# Patient Record
Sex: Female | Born: 1985
Health system: Southern US, Community
[De-identification: ages and names within clinical notes are randomized; demographics above are authoritative.]

## PROBLEM LIST (undated history)

## (undated) ENCOUNTER — Inpatient Hospital Stay (HOSPITAL_COMMUNITY): Payer: Self-pay

## (undated) DIAGNOSIS — Z8632 Personal history of gestational diabetes: Secondary | ICD-10-CM

## (undated) HISTORY — PX: OTHER SURGICAL HISTORY: SHX169

## (undated) HISTORY — DX: Personal history of gestational diabetes: Z86.32

---

## 2011-11-11 DIAGNOSIS — O1490 Unspecified pre-eclampsia, unspecified trimester: Secondary | ICD-10-CM

## 2017-04-05 ENCOUNTER — Encounter (HOSPITAL_COMMUNITY): Payer: Self-pay | Admitting: *Deleted

## 2017-04-05 ENCOUNTER — Inpatient Hospital Stay (HOSPITAL_COMMUNITY)
Admission: AD | Admit: 2017-04-05 | Discharge: 2017-04-05 | Disposition: A | Discharge status: Home / Self Care | Payer: Medicaid Other | Source: Ambulatory Visit | Attending: Family Medicine | Admitting: Family Medicine

## 2017-04-05 ENCOUNTER — Inpatient Hospital Stay (HOSPITAL_COMMUNITY): Payer: Medicaid Other

## 2017-04-05 ENCOUNTER — Other Ambulatory Visit: Payer: Self-pay

## 2017-04-05 DIAGNOSIS — O26892 Other specified pregnancy related conditions, second trimester: Secondary | ICD-10-CM | POA: Insufficient documentation

## 2017-04-05 DIAGNOSIS — Z3A15 15 weeks gestation of pregnancy: Secondary | ICD-10-CM | POA: Diagnosis not present

## 2017-04-05 DIAGNOSIS — O99891 Other specified diseases and conditions complicating pregnancy: Secondary | ICD-10-CM

## 2017-04-05 DIAGNOSIS — M549 Dorsalgia, unspecified: Secondary | ICD-10-CM | POA: Insufficient documentation

## 2017-04-05 DIAGNOSIS — O9989 Other specified diseases and conditions complicating pregnancy, childbirth and the puerperium: Secondary | ICD-10-CM | POA: Diagnosis not present

## 2017-04-05 DIAGNOSIS — R42 Dizziness and giddiness: Secondary | ICD-10-CM | POA: Diagnosis present

## 2017-04-05 LAB — COMPREHENSIVE METABOLIC PANEL
ALBUMIN: 3.6 g/dL (ref 3.5–5.0)
ALT: 7 U/L — ABNORMAL LOW (ref 14–54)
ANION GAP: 11 (ref 5–15)
AST: 16 U/L (ref 15–41)
Alkaline Phosphatase: 52 U/L (ref 38–126)
BUN: 10 mg/dL (ref 6–20)
CHLORIDE: 104 mmol/L (ref 101–111)
CO2: 21 mmol/L — AB (ref 22–32)
Calcium: 9.9 mg/dL (ref 8.9–10.3)
Creatinine, Ser: 0.61 mg/dL (ref 0.44–1.00)
GFR calc Af Amer: >60 mL/min (ref >60)
GFR calc non Af Amer: >60 mL/min (ref >60)
GLUCOSE: 101 mg/dL — AB (ref 65–99)
POTASSIUM: 3.5 mmol/L (ref 3.5–5.1)
SODIUM: 136 mmol/L (ref 135–145)
Total Bilirubin: 0.3 mg/dL (ref 0.3–1.2)
Total Protein: 8.2 g/dL — ABNORMAL HIGH (ref 6.5–8.1)

## 2017-04-05 LAB — HCG, QUANTITATIVE, PREGNANCY: hCG, Beta Chain, Quant, S: 93531 m[IU]/mL — ABNORMAL HIGH (ref <5)

## 2017-04-05 LAB — CBC
HCT: 38.2 % (ref 36.0–46.0)
Hemoglobin: 12.8 g/dL (ref 12.0–15.0)
MCH: 27.8 pg (ref 26.0–34.0)
MCHC: 33.5 g/dL (ref 30.0–36.0)
MCV: 83 fL (ref 78.0–100.0)
Platelets: 323 10*3/uL (ref 150–400)
RBC: 4.6 MIL/uL (ref 3.87–5.11)
RDW: 12.8 % (ref 11.5–15.5)
WBC: 16.4 10*3/uL — AB (ref 4.0–10.5)

## 2017-04-05 LAB — URINALYSIS, ROUTINE W REFLEX MICROSCOPIC
Bilirubin Urine: NEGATIVE
GLUCOSE, UA: NEGATIVE mg/dL
HGB URINE DIPSTICK: NEGATIVE
KETONES UR: NEGATIVE mg/dL
LEUKOCYTES UA: NEGATIVE
Nitrite: NEGATIVE
PROTEIN: NEGATIVE mg/dL
Specific Gravity, Urine: 1.017 (ref 1.005–1.030)
pH: 7 (ref 5.0–8.0)

## 2017-04-05 LAB — ABO/RH: ABO/RH(D): O POS

## 2017-04-05 NOTE — MAU Note (Addendum)
Today started feeling dizzy, at first it was when she stands, now it is even when she just sits up.  Low back has been hurting for a while, trying to wait for her first appt.  Got kind of scared as dizzy as she is feeling, thought she needed to come in.  preg confirmed at health dept

## 2017-04-05 NOTE — MAU Provider Note (Signed)
Chief Complaint: Back Pain and Dizziness   None     SUBJECTIVE HPI: Gina Garza is a 31 y.o. G2P1001 at 7939w1d by LMP who presents to maternity admissions reporting back pain x 3-4 days, worse when standing. The pain is constant sharp pain that does radiate around to her abdomen occasionally. She has no hx of back pain prior to the pregnancy.  She also reports dizziness today, enough that she did not go to work. She feels lightheaded when standing and has to sit down. Sitting/lying down makes the dizziness better. She is eating and drinking enough but reports her appetite is low. There are no other associated symptoms. She has a sure LMP but has irregular periods. She has not yet started prenatal care. She denies vaginal bleeding, vaginal itching/burning, urinary symptoms, h/a, dizziness, n/v, or fever/chills.     HPI  Past Medical History:  Diagnosis Date  . Hypertension    Pre-eclampsia   Past Surgical History:  Procedure Laterality Date  . CESAREAN SECTION     Social History   Socioeconomic History  . Marital status: Single    Spouse name: Not on file  . Number of children: Not on file  . Years of education: Not on file  . Highest education level: Not on file  Social Needs  . Financial resource strain: Not on file  . Food insecurity - worry: Not on file  . Food insecurity - inability: Not on file  . Transportation needs - medical: Not on file  . Transportation needs - non-medical: Not on file  Occupational History  . Not on file  Tobacco Use  . Smoking status: Never Smoker  . Smokeless tobacco: Never Used  Substance and Sexual Activity  . Alcohol use: No    Frequency: Never  . Drug use: No  . Sexual activity: Yes  Other Topics Concern  . Not on file  Social History Narrative  . Not on file   No current facility-administered medications on file prior to encounter.    Current Outpatient Medications on File Prior to Encounter  Medication Sig Dispense Refill  .  Prenatal Vit-Fe Fumarate-FA (MULTIVITAMIN-PRENATAL) 27-0.8 MG TABS tablet Take 1 tablet by mouth daily at 12 noon.     Not on File  ROS:  Review of Systems  Constitutional: Negative for chills, fatigue and fever.  Respiratory: Negative for shortness of breath.   Cardiovascular: Negative for chest pain.  Gastrointestinal: Positive for abdominal pain. Negative for nausea and vomiting.  Genitourinary: Negative for difficulty urinating, dysuria, flank pain, pelvic pain, vaginal bleeding, vaginal discharge and vaginal pain.  Musculoskeletal: Positive for back pain.  Neurological: Positive for dizziness and light-headedness. Negative for headaches.  Psychiatric/Behavioral: Negative.      I have reviewed patient's Past Medical Hx, Surgical Hx, Family Hx, Social Hx, medications and allergies.   Physical Exam   Patient Vitals for the past 24 hrs:  BP Temp Temp src Pulse Resp SpO2 Weight  04/05/17 2016 126/85 - - 86 - - -  04/05/17 2004 133/89 - - 81 - - -  04/05/17 1858 132/76 - - 73 - - -  04/05/17 1853 (!) 141/95 98.2 F (36.8 C) Oral 85 18 100 % 278 lb (126.1 kg)   Constitutional: Well-developed, well-nourished female in no acute distress.  Cardiovascular: normal rate Respiratory: normal effort GI: Abd soft, non-tender. No rebound tenderness or guarding. Pos BS x 4 MS: Extremities nontender, no edema, normal ROM Neurologic: Alert and oriented x 4.  GU:  Neg CVAT.  PELVIC EXAM: Deferred  Unable to obtain FHT by doppler   LAB RESULTS Results for orders placed or performed during the hospital encounter of 04/05/17 (from the past 24 hour(s))  Urinalysis, Routine w reflex microscopic     Status: Abnormal   Collection Time: 04/05/17  7:00 PM  Result Value Ref Range   Color, Urine YELLOW YELLOW   APPearance CLOUDY (A) CLEAR   Specific Gravity, Urine 1.017 1.005 - 1.030   pH 7.0 5.0 - 8.0   Glucose, UA NEGATIVE NEGATIVE mg/dL   Hgb urine dipstick NEGATIVE NEGATIVE   Bilirubin  Urine NEGATIVE NEGATIVE   Ketones, ur NEGATIVE NEGATIVE mg/dL   Protein, ur NEGATIVE NEGATIVE mg/dL   Nitrite NEGATIVE NEGATIVE   Leukocytes, UA NEGATIVE NEGATIVE  CBC     Status: Abnormal   Collection Time: 04/05/17  8:38 PM  Result Value Ref Range   WBC 16.4 (H) 4.0 - 10.5 K/uL   RBC 4.60 3.87 - 5.11 MIL/uL   Hemoglobin 12.8 12.0 - 15.0 g/dL   HCT 40.938.2 81.136.0 - 91.446.0 %   MCV 83.0 78.0 - 100.0 fL   MCH 27.8 26.0 - 34.0 pg   MCHC 33.5 30.0 - 36.0 g/dL   RDW 78.212.8 95.611.5 - 21.315.5 %   Platelets 323 150 - 400 K/uL  Comprehensive metabolic panel     Status: Abnormal   Collection Time: 04/05/17  8:38 PM  Result Value Ref Range   Sodium 136 135 - 145 mmol/L   Potassium 3.5 3.5 - 5.1 mmol/L   Chloride 104 101 - 111 mmol/L   CO2 21 (L) 22 - 32 mmol/L   Glucose, Bld 101 (H) 65 - 99 mg/dL   BUN 10 6 - 20 mg/dL   Creatinine, Ser 0.860.61 0.44 - 1.00 mg/dL   Calcium 9.9 8.9 - 57.810.3 mg/dL   Total Protein 8.2 (H) 6.5 - 8.1 g/dL   Albumin 3.6 3.5 - 5.0 g/dL   AST 16 15 - 41 U/L   ALT 7 (L) 14 - 54 U/L   Alkaline Phosphatase 52 38 - 126 U/L   Total Bilirubin 0.3 0.3 - 1.2 mg/dL   GFR calc non Af Amer >60 >60 mL/min   GFR calc Af Amer >60 >60 mL/min   Anion gap 11 5 - 15  ABO/Rh     Status: None (Preliminary result)   Collection Time: 04/05/17  8:38 PM  Result Value Ref Range   ABO/RH(D) O POS     --/--/O POS (11/27 2038)  IMAGING No results found. [place] Ob Comp Less 14 Wks  Result Date: 04/05/2017 CLINICAL DATA:  Low back pain EXAM: OBSTETRIC <14 WK ULTRASOUND TECHNIQUE: Transabdominal ultrasound was performed for evaluation of the gestation as well as the maternal uterus and adnexal regions. COMPARISON:  None. FINDINGS: Intrauterine gestational sac: Single intrauterine gestation Yolk sac:  Not visible Embryo:  Visible Cardiac Activity: 149 beats per minute CRL:   77.4  mm   13 w 6 d                  US EDC: 10/05/2017 Subchorionic hemorrhage:  None visualized. Maternal uterus/adnexae: Bilateral  ovaries are within normal limits. Right ovary measures 3.1 x 2.4 x 2.9 cm. Left ovary measures 2.8 x 2.4 x 2.2 cm. No significant free fluid IMPRESSION: Single viable intrauterine pregnancy as above. No other specific abnormality is seen Electronically Signed   By: Jasmine PangKim  Fujinaga M.D.   On: 04/05/2017 21:27  MAU Management/MDM: Ordered labs and reviewed results. [place] with IUP at [redacted]w[redacted]d.  Back pain likely musculoskeletal.  Rest/ice/heat/Tylenol/pregnancy support belt for pain.  Increase PO fluids and food intake to improve dizziness. F/U with prenatal care as scheduled. Return to MAU as needed for emergencies.    Pt discharged with strict second trimester precautions.  ASSESSMENT 1. Back pain complicating pregnancy in first trimester     PLAN Discharge home  Allergies as of 04/05/2017   No Known Allergies     Medication List    TAKE these medications   calcium carbonate 500 MG chewable tablet Commonly known as:  TUMS - dosed in mg elemental calcium Chew 2 tablets by mouth 2 (two) times daily as needed for indigestion or heartburn.   multivitamin-prenatal 27-0.8 MG Tabs tablet Take 1 tablet by mouth daily at 12 noon.        Sharen Counter Certified Nurse-Midwife 04/05/2017  9:12 PM

## 2017-04-06 ENCOUNTER — Encounter: Payer: Self-pay | Admitting: Advanced Practice Midwife

## 2017-04-14 ENCOUNTER — Encounter: Payer: Medicaid Other | Admitting: Obstetrics

## 2017-04-21 ENCOUNTER — Encounter: Payer: Medicaid Other | Admitting: Obstetrics & Gynecology

## 2017-05-04 ENCOUNTER — Encounter: Payer: Self-pay | Admitting: Family Medicine

## 2017-05-04 ENCOUNTER — Ambulatory Visit (INDEPENDENT_AMBULATORY_CARE_PROVIDER_SITE_OTHER): Payer: Medicaid Other | Admitting: Family Medicine

## 2017-05-04 VITALS — BP 115/58 | HR 100 | Ht 67.5 in | Wt 280.0 lb

## 2017-05-04 DIAGNOSIS — M545 Low back pain, unspecified: Secondary | ICD-10-CM

## 2017-05-04 DIAGNOSIS — M9903 Segmental and somatic dysfunction of lumbar region: Secondary | ICD-10-CM

## 2017-05-04 DIAGNOSIS — Z3480 Encounter for supervision of other normal pregnancy, unspecified trimester: Secondary | ICD-10-CM

## 2017-05-04 DIAGNOSIS — Z3482 Encounter for supervision of other normal pregnancy, second trimester: Secondary | ICD-10-CM

## 2017-05-04 DIAGNOSIS — O09292 Supervision of pregnancy with other poor reproductive or obstetric history, second trimester: Secondary | ICD-10-CM

## 2017-05-04 DIAGNOSIS — O09299 Supervision of pregnancy with other poor reproductive or obstetric history, unspecified trimester: Secondary | ICD-10-CM | POA: Insufficient documentation

## 2017-05-04 DIAGNOSIS — O34219 Maternal care for unspecified type scar from previous cesarean delivery: Secondary | ICD-10-CM

## 2017-05-04 NOTE — Progress Notes (Signed)
mPatient complaining of back pain. Patient has felt fetal movement for about a week. Armandina StammerJennifer Howard RN

## 2017-05-04 NOTE — Progress Notes (Signed)
Subjective:  Gina Garza is a G2P1001 3681w0d being seen today for her first obstetrical visit.  Her obstetrical history is significant for obesity, previous cesarean section for failure to dilate. Her last pregnancy was an induction for preeclamsia. Patient does not intend to breast feed. Pregnancy history fully reviewed.  Patient reports backache.  BP (!) 115/58   Pulse 100   Ht 5' 7.5" (1.715 m)   Wt 280 lb (127 kg)   BMI 43.21 kg/m   HISTORY: OB History  Gravida Para Term Preterm AB Living  2 1 1     1   SAB TAB Ectopic Multiple Live Births          1    # Outcome Date GA Lbr Len/2nd Weight Sex Delivery Anes PTL Lv  2 Current           1 Term 11/11/11 7455w4d  7 lb 11 oz (3.487 kg) F CS-Unspec   LIV     Complications: Failure to Progress in Second Stage,Pre-eclampsia      No past medical history on file.  Past Surgical History:  Procedure Laterality Date  . CESAREAN SECTION      Family History  Problem Relation Age of Onset  . Hypertension Mother   . Stroke Maternal Grandmother   . Cancer Neg Hx      Exam    Uterus:     Pelvic Exam:    Perineum: No Hemorrhoids, Normal Perineum   Vulva: normal, Bartholin's, Urethra, Skene's normal   Vagina:  normal mucosa   Cervix: multiparous appearance, no cervical motion tenderness and no lesions   Adnexa: not evaluated   Bony Pelvis: gynecoid  System: Breast:  normal appearance, no masses or tenderness, Inspection negative, No nipple retraction or dimpling, No nipple discharge or bleeding, No axillary or supraclavicular adenopathy, scars on right axilla and chest from cyst removal   Skin: normal coloration and turgor, no rashes    Neurologic: gait normal; reflexes normal and symmetric   MSK: Tenderness, tissue texture changes to left lumbar paraspinal muscles.   Extremities: normal strength, tone, and muscle mass   HEENT PERRLA and extra ocular movement intact   Mouth/Teeth mucous membranes moist, pharynx normal  without lesions   Neck supple and no masses   Cardiovascular: regular rate and rhythm, no murmurs or gallops   Respiratory:  appears well, vitals normal, no respiratory distress, acyanotic, normal RR, ear and throat exam is normal, neck free of mass or lymphadenopathy, chest clear, no wheezing, crepitations, rhonchi, normal symmetric air entry   Abdomen: soft, non-tender; bowel sounds normal; no masses,  no organomegaly   Urinary: urethral meatus normal    OSE: Head   Cervical   Thoracic   Rib   Lumbar L5 ESRL, L1 ESRR  Sacrum L/L  Pelvis R ant innom      Assessment:    Pregnancy: G2P1001 Patient Active Problem List   Diagnosis Date Noted  . Supervision of other normal pregnancy, antepartum 05/04/2017  . Morbid obesity (HCC) 05/04/2017  . History of pre-eclampsia in prior pregnancy, currently pregnant 05/04/2017  . History of cesarean section complicating pregnancy 05/04/2017      Plan:   1. Supervision of other normal pregnancy, antepartum Desires quad screen - US MFM OB DETAIL +14 WK; Future - Vitamin D (25 hydroxy) - Obstetric Panel, Including HIV - Inheritest Society Guided - HgB A1c - GC/Chlamydia probe amp ([place])[facility]  2. Morbid obesity (HCC) Discussed 15# weight gain in  pregnancy  3. History of pre-eclampsia in prior pregnancy, currently pregnant Start ASA 81mg . Check CMP and UP:C.  4. History of cesarean section complicating pregnancy Based on BMI, failure to dilate, 25% likelihood of successful VBAC per calculator. Pt leaning on RLTCS. Will obtain records.  5. Acute left-sided low back pain without sciatica 6. Somatic dysfunction of lumbar region OMT done after patient permission. HVLA technique utilized. 3 areas treated with improvement of tissue texture and joint mobility. Patient tolerated procedure well.       Problem list reviewed and updated. 75% of 45 min visit spent on counseling and coordination of care.    Levie HeritageJacob J  Zamire Whitehurst 05/04/2017

## 2017-05-11 ENCOUNTER — Encounter (HOSPITAL_COMMUNITY): Payer: Self-pay | Admitting: Family Medicine

## 2017-05-16 ENCOUNTER — Encounter (HOSPITAL_COMMUNITY): Payer: Self-pay

## 2017-05-16 ENCOUNTER — Other Ambulatory Visit: Payer: Self-pay | Admitting: Family Medicine

## 2017-05-16 ENCOUNTER — Ambulatory Visit (HOSPITAL_COMMUNITY)
Admission: RE | Admit: 2017-05-16 | Discharge: 2017-05-16 | Disposition: A | Discharge status: Home / Self Care | Payer: Medicaid Other | Source: Ambulatory Visit | Attending: Family Medicine | Admitting: Family Medicine

## 2017-05-16 DIAGNOSIS — Z363 Encounter for antenatal screening for malformations: Secondary | ICD-10-CM | POA: Insufficient documentation

## 2017-05-16 DIAGNOSIS — Z3480 Encounter for supervision of other normal pregnancy, unspecified trimester: Secondary | ICD-10-CM

## 2017-05-16 DIAGNOSIS — Z3A19 19 weeks gestation of pregnancy: Secondary | ICD-10-CM | POA: Insufficient documentation

## 2017-05-16 DIAGNOSIS — O99212 Obesity complicating pregnancy, second trimester: Secondary | ICD-10-CM

## 2017-05-16 DIAGNOSIS — Z98891 History of uterine scar from previous surgery: Secondary | ICD-10-CM

## 2017-05-16 DIAGNOSIS — Z3482 Encounter for supervision of other normal pregnancy, second trimester: Secondary | ICD-10-CM | POA: Diagnosis not present

## 2017-05-16 HISTORY — DX: Morbid (severe) obesity due to excess calories: E66.01

## 2017-05-16 LAB — OBSTETRIC PANEL, INCLUDING HIV
ANTIBODY SCREEN: NEGATIVE
BASOS: 0 %
Basophils Absolute: 0 10*3/uL (ref 0.0–0.2)
EOS (ABSOLUTE): 0.3 10*3/uL (ref 0.0–0.4)
Eos: 2 %
HEMATOCRIT: 36.2 % (ref 34.0–46.6)
HEP B S AG: NEGATIVE
HIV SCREEN 4TH GENERATION: NONREACTIVE
Hemoglobin: 12.3 g/dL (ref 11.1–15.9)
Immature Grans (Abs): 0 10*3/uL (ref 0.0–0.1)
Immature Granulocytes: 0 %
Lymphocytes Absolute: 3.2 10*3/uL — ABNORMAL HIGH (ref 0.7–3.1)
Lymphs: 24 %
MCH: 27.6 pg (ref 26.6–33.0)
MCHC: 34 g/dL (ref 31.5–35.7)
MCV: 81 fL (ref 79–97)
MONOCYTES: 7 %
Monocytes Absolute: 0.9 10*3/uL (ref 0.1–0.9)
NEUTROS ABS: 9.1 10*3/uL — AB (ref 1.4–7.0)
Neutrophils: 67 %
Platelets: 334 10*3/uL (ref 150–379)
RBC: 4.45 x10E6/uL (ref 3.77–5.28)
RDW: 13.9 % (ref 12.3–15.4)
RPR: NONREACTIVE
Rh Factor: POSITIVE
WBC: 13.7 10*3/uL — ABNORMAL HIGH (ref 3.4–10.8)

## 2017-05-16 LAB — INHERITEST SOCIETY GUIDED

## 2017-05-16 LAB — VITAMIN D 25 HYDROXY (VIT D DEFICIENCY, FRACTURES): VIT D 25 HYDROXY: 11.5 ng/mL — AB (ref 30.0–100.0)

## 2017-05-16 LAB — HEMOGLOBIN A1C
ESTIMATED AVERAGE GLUCOSE: 123 mg/dL
Hgb A1c MFr Bld: 5.9 % — ABNORMAL HIGH (ref 4.8–5.6)

## 2017-05-17 ENCOUNTER — Encounter: Payer: Self-pay | Admitting: Family Medicine

## 2017-05-17 ENCOUNTER — Other Ambulatory Visit (HOSPITAL_COMMUNITY): Payer: Self-pay | Admitting: *Deleted

## 2017-05-17 ENCOUNTER — Other Ambulatory Visit: Payer: Self-pay | Admitting: Family Medicine

## 2017-05-17 DIAGNOSIS — O9989 Other specified diseases and conditions complicating pregnancy, childbirth and the puerperium: Secondary | ICD-10-CM

## 2017-05-17 DIAGNOSIS — Z362 Encounter for other antenatal screening follow-up: Secondary | ICD-10-CM

## 2017-05-17 DIAGNOSIS — Z2839 Other underimmunization status: Secondary | ICD-10-CM | POA: Insufficient documentation

## 2017-05-17 DIAGNOSIS — Z283 Underimmunization status: Secondary | ICD-10-CM | POA: Insufficient documentation

## 2017-05-17 DIAGNOSIS — E559 Vitamin D deficiency, unspecified: Secondary | ICD-10-CM | POA: Insufficient documentation

## 2017-05-17 MED ORDER — VITAMIN D (ERGOCALCIFEROL) 1.25 MG (50000 UNIT) PO CAPS: 50000.0000 [IU] | ORAL_CAPSULE | ORAL | 0 refills | Status: DC

## 2017-05-18 ENCOUNTER — Telehealth: Payer: Self-pay

## 2017-05-18 NOTE — Telephone Encounter (Signed)
-----   Message from Levie HeritageJacob J Stinson, DO sent at 05/17/2017 10:29 AM EST ----- Vit D deficient - replacement prescribed: 1 tab weekly. HgA1c elevated - needs early 2hr GTT

## 2017-05-18 NOTE — Telephone Encounter (Signed)
Left message for patient to return call to office for results. Jennifer Howard RN  

## 2017-05-18 NOTE — Telephone Encounter (Signed)
Patient also made aware for need of 2 hr glucose testing since she had an elevated hgA1c. Armandina StammerJennifer Clary Meeker RN

## 2017-05-18 NOTE — Telephone Encounter (Signed)
Patient called back- patient made aware of vitamin D deficiency. Patient made aware that medication for vitamin D has been sent to her pharmacy once a week.   Patient

## 2017-05-23 ENCOUNTER — Ambulatory Visit: Payer: Medicaid Other

## 2017-05-23 DIAGNOSIS — R899 Unspecified abnormal finding in specimens from other organs, systems and tissues: Secondary | ICD-10-CM

## 2017-05-23 DIAGNOSIS — Z3490 Encounter for supervision of normal pregnancy, unspecified, unspecified trimester: Secondary | ICD-10-CM

## 2017-05-23 NOTE — Progress Notes (Signed)
Patient presents for early two hour gtt. And will draw quad screen. Patient sent to lab. Gina StammerJennifer Marcos Peloso RN

## 2017-05-26 ENCOUNTER — Telehealth: Payer: Self-pay

## 2017-05-26 ENCOUNTER — Other Ambulatory Visit: Payer: Self-pay | Admitting: Family Medicine

## 2017-05-26 DIAGNOSIS — R7309 Other abnormal glucose: Secondary | ICD-10-CM

## 2017-05-26 LAB — AFP TETRA
DIA Mom Value: 1.31
DIA VALUE (EIA): 194.34 pg/mL
DSR (By Age)    1 IN: 724
DSR (Second Trimester) 1 IN: 2785
GESTATIONAL AGE AFP: 20.5 wk
MSAFP Mom: 0.88
MSAFP: 40.3 ng/mL
MSHCG Mom: 2.5
MSHCG: 38792 m[IU]/mL
Maternal Age At EDD: 29.7 yr
OSB RISK: 10000
T18 (By Age): 1:2821 {titer}
Test Results:: NEGATIVE
UE3 MOM: 1.35
UE3 VALUE: 2.26 ng/mL
Weight: 280 [lb_av]

## 2017-05-26 LAB — GLUCOSE TOLERANCE, 2 HOURS W/ 1HR
GLUCOSE, 1 HOUR: 198 mg/dL — AB (ref 65–179)
GLUCOSE, FASTING: 103 mg/dL — AB (ref 65–91)
Glucose, 2 hour: 135 mg/dL (ref 65–152)

## 2017-05-26 MED ORDER — ACCU-CHEK NANO SMARTVIEW W/DEVICE KIT: 1.0000 | PACK | 0 refills | Status: DC

## 2017-05-26 MED ORDER — ACCU-CHEK FASTCLIX LANCETS MISC: 1.0000 [IU] | Freq: Four times a day (QID) | 12 refills | Status: DC

## 2017-05-26 MED ORDER — GLUCOSE BLOOD VI STRP: ORAL_STRIP | 12 refills | Status: DC

## 2017-05-26 NOTE — Telephone Encounter (Signed)
Left message for patient to return call to office. Jennifer Howard  RN 

## 2017-05-26 NOTE — Telephone Encounter (Signed)
-----   Message from Levie HeritageJacob J Stinson, DO sent at 05/26/2017  8:37 AM EST ----- Failed 2hr GTT - needs GDM education. I have sent testing supplies to pharmacy. Please notify patient.

## 2017-05-27 NOTE — Telephone Encounter (Signed)
Referral placed to nutrition and diabetes.  Left message for patient to return call to office. Armandina StammerJennifer Howard RN BSN

## 2017-05-30 NOTE — Telephone Encounter (Signed)
Patient returned call to office and made aware of elevated 2 hour glucose tolerance test. Made aware that she had testing supplies called into her pharmacy and the need for diabetes education classes. Patient states understanding. Armandina StammerJennifer Shardae Kleinman RN

## 2017-06-08 ENCOUNTER — Other Ambulatory Visit (HOSPITAL_COMMUNITY)
Admission: RE | Admit: 2017-06-08 | Discharge: 2017-06-08 | Disposition: A | Discharge status: Home / Self Care | Payer: Medicaid Other | Source: Ambulatory Visit | Attending: Obstetrics & Gynecology | Admitting: Obstetrics & Gynecology

## 2017-06-08 ENCOUNTER — Encounter: Payer: Self-pay | Admitting: Obstetrics & Gynecology

## 2017-06-08 ENCOUNTER — Ambulatory Visit (INDEPENDENT_AMBULATORY_CARE_PROVIDER_SITE_OTHER): Payer: Medicaid Other | Admitting: Obstetrics & Gynecology

## 2017-06-08 VITALS — BP 124/74 | HR 105 | Wt 283.0 lb

## 2017-06-08 DIAGNOSIS — O09299 Supervision of pregnancy with other poor reproductive or obstetric history, unspecified trimester: Secondary | ICD-10-CM

## 2017-06-08 DIAGNOSIS — Z3480 Encounter for supervision of other normal pregnancy, unspecified trimester: Secondary | ICD-10-CM

## 2017-06-08 DIAGNOSIS — N898 Other specified noninflammatory disorders of vagina: Secondary | ICD-10-CM

## 2017-06-08 DIAGNOSIS — O09292 Supervision of pregnancy with other poor reproductive or obstetric history, second trimester: Secondary | ICD-10-CM

## 2017-06-08 DIAGNOSIS — O26899 Other specified pregnancy related conditions, unspecified trimester: Principal | ICD-10-CM

## 2017-06-08 DIAGNOSIS — O34219 Maternal care for unspecified type scar from previous cesarean delivery: Secondary | ICD-10-CM

## 2017-06-08 DIAGNOSIS — O9989 Other specified diseases and conditions complicating pregnancy, childbirth and the puerperium: Secondary | ICD-10-CM

## 2017-06-08 DIAGNOSIS — E559 Vitamin D deficiency, unspecified: Secondary | ICD-10-CM

## 2017-06-08 DIAGNOSIS — O24419 Gestational diabetes mellitus in pregnancy, unspecified control: Secondary | ICD-10-CM

## 2017-06-08 DIAGNOSIS — Z8632 Personal history of gestational diabetes: Secondary | ICD-10-CM | POA: Insufficient documentation

## 2017-06-08 DIAGNOSIS — Z283 Underimmunization status: Secondary | ICD-10-CM

## 2017-06-08 DIAGNOSIS — Z2839 Other underimmunization status: Secondary | ICD-10-CM

## 2017-06-08 DIAGNOSIS — O26892 Other specified pregnancy related conditions, second trimester: Secondary | ICD-10-CM

## 2017-06-08 NOTE — Progress Notes (Signed)
Pt c/o yeast infection sx (doing a self swab). PT started throwing up 10 days ago and has thrown up everyday (sometimes twice a day) since. Pt states the vomit is pregnancy related, no fever. Pt c/o headaches. No visual changes. Pt has not been checking her blood sugar. Wants to talk to dr first.

## 2017-06-08 NOTE — Progress Notes (Signed)
   PRENATAL VISIT NOTE  Subjective:  Gina Garza is a 32 y.o. G2P1001 at 7444w0d being seen today for ongoing prenatal care.  She is currently monitored for the following issues for this high-risk pregnancy and has Supervision of other normal pregnancy, antepartum; Morbid obesity (HCC); History of pre-eclampsia in prior pregnancy, currently pregnant; History of cesarean section complicating pregnancy; Vitamin D deficiency; Rubella non-immune status, antepartum; and Gestational diabetes mellitus (GDM) affecting pregnancy on their problem list.  Patient reports vaginal irritation and vaginal discharge.  Contractions: Not present. Vag. Bleeding: None.  Movement: Present. Denies leaking of fluid.   The following portions of the patient's history were reviewed and updated as appropriate: allergies, current medications, past family history, past medical history, past social history, past surgical history and problem list. Problem list updated.  Objective:   Vitals:   06/08/17 1526  BP: 124/74  Pulse: (!) 105  Weight: 283 lb (128.4 kg)    Fetal Status: Fetal Heart Rate (bpm): 145   Movement: Present     General:  Alert, oriented and cooperative. Patient is in no acute distress.  Skin: Skin is warm and dry. No rash noted.   Cardiovascular: Normal heart rate noted  Respiratory: Normal respiratory effort, no problems with respiration noted  Abdomen: Soft, gravid, appropriate for gestational age.  Pain/Pressure: Absent     Pelvic: Cervical exam deferred        Extremities: Normal range of motion.  Edema: None  Mental Status:  Normal mood and affect. Normal behavior. Normal judgment and thought content.   Assessment and Plan:  Pregnancy: G2P1001 at 6144w0d  1. Vaginal discharge during pregnancy, antepartum  - Cervicovaginal ancillary only  2. Supervision of other normal pregnancy, antepartum Pt informed of her abnormal 2 hour GTT. I explained to her the risks   3. Rubella non-immune  status, antepartum Needs vaccine PP  4. Morbid obesity (HCC)  5. History of pre-eclampsia in prior pregnancy, currently pregnant Pt has not started taking   6. History of cesarean section complicating pregnancy  7. Vitamin D deficiency  8. Gestational diabetes mellitus (GDM) affecting pregnancy Pt has an apt with the DM educator.  I discussed with her diet and when to check her glc I reviewed the normal ranges but, encouraged her to go to her appt as scheduled.     Preterm labor symptoms and general obstetric precautions including but not limited to vaginal bleeding, contractions, leaking of fluid and fetal movement were reviewed in detail with the patient. Please refer to After Visit Summary for other counseling recommendations.  Return in about 2 weeks (around 06/22/2017).   Willodean Rosenthalarolyn Harraway-Smith, MD

## 2017-06-10 LAB — CERVICOVAGINAL ANCILLARY ONLY
Bacterial vaginitis: POSITIVE — AB
CANDIDA VAGINITIS: POSITIVE — AB

## 2017-06-13 ENCOUNTER — Encounter (HOSPITAL_COMMUNITY): Payer: Self-pay

## 2017-06-13 ENCOUNTER — Ambulatory Visit (HOSPITAL_COMMUNITY)
Admission: RE | Admit: 2017-06-13 | Discharge: 2017-06-13 | Disposition: A | Discharge status: Home / Self Care | Payer: Medicaid Other | Source: Ambulatory Visit | Attending: Family Medicine | Admitting: Family Medicine

## 2017-06-13 DIAGNOSIS — Z362 Encounter for other antenatal screening follow-up: Secondary | ICD-10-CM | POA: Insufficient documentation

## 2017-06-13 DIAGNOSIS — E669 Obesity, unspecified: Secondary | ICD-10-CM | POA: Diagnosis not present

## 2017-06-13 DIAGNOSIS — O34219 Maternal care for unspecified type scar from previous cesarean delivery: Secondary | ICD-10-CM | POA: Insufficient documentation

## 2017-06-13 DIAGNOSIS — O99212 Obesity complicating pregnancy, second trimester: Secondary | ICD-10-CM | POA: Insufficient documentation

## 2017-06-13 DIAGNOSIS — Z3A23 23 weeks gestation of pregnancy: Secondary | ICD-10-CM | POA: Insufficient documentation

## 2017-06-14 ENCOUNTER — Other Ambulatory Visit (HOSPITAL_COMMUNITY): Payer: Self-pay | Admitting: *Deleted

## 2017-06-14 ENCOUNTER — Other Ambulatory Visit: Payer: Self-pay | Admitting: Obstetrics & Gynecology

## 2017-06-14 DIAGNOSIS — O24419 Gestational diabetes mellitus in pregnancy, unspecified control: Secondary | ICD-10-CM

## 2017-06-14 MED ORDER — MICONAZOLE NITRATE 2 % VA CREA: 1.0000 | TOPICAL_CREAM | Freq: Every day | VAGINAL | 2 refills | Status: DC

## 2017-06-14 MED ORDER — METRONIDAZOLE 500 MG PO TABS: 500.0000 mg | ORAL_TABLET | Freq: Two times a day (BID) | ORAL | 0 refills | Status: DC

## 2017-06-15 ENCOUNTER — Telehealth: Payer: Self-pay

## 2017-06-15 ENCOUNTER — Encounter: Payer: Medicaid Other | Attending: Obstetrics | Admitting: Registered"

## 2017-06-15 DIAGNOSIS — O24419 Gestational diabetes mellitus in pregnancy, unspecified control: Secondary | ICD-10-CM

## 2017-06-15 DIAGNOSIS — Z713 Dietary counseling and surveillance: Secondary | ICD-10-CM | POA: Diagnosis present

## 2017-06-15 DIAGNOSIS — Z3A23 23 weeks gestation of pregnancy: Secondary | ICD-10-CM | POA: Diagnosis not present

## 2017-06-15 NOTE — Telephone Encounter (Signed)
Left message for patient about calling us back for her results and prescriptions. Armandina StammerJennifer Howard RNBSN

## 2017-06-20 ENCOUNTER — Encounter: Payer: Self-pay | Admitting: Registered"

## 2017-06-20 NOTE — Progress Notes (Signed)
Patient was seen on 06/15/2017 for Gestational Diabetes self-management class at [facility]. The following learning objectives were met by the patient during this course:   States the definition of Gestational Diabetes  States why dietary management is important in controlling blood glucose  Describes the effects each nutrient has on blood glucose levels  Demonstrates ability to create a balanced meal plan  Demonstrates carbohydrate counting   States when to check blood glucose levels  Demonstrates proper blood glucose monitoring techniques  States the effect of stress and exercise on blood glucose levels  States the importance of limiting caffeine and abstaining from alcohol and smoking  Blood glucose monitor given: none  Patient instructed to monitor glucose levels: FBS: 60 - <95; 1 hour: <140; 2 hour: <120  Patient received handouts:  Nutrition Diabetes and Pregnancy, including carb counting list  Patient will be seen for follow-up as needed.

## 2017-06-21 ENCOUNTER — Encounter: Payer: Self-pay | Admitting: Advanced Practice Midwife

## 2017-06-21 ENCOUNTER — Ambulatory Visit (INDEPENDENT_AMBULATORY_CARE_PROVIDER_SITE_OTHER): Payer: Medicaid Other | Admitting: Advanced Practice Midwife

## 2017-06-21 VITALS — BP 108/69 | HR 98 | Wt 280.0 lb

## 2017-06-21 DIAGNOSIS — O34219 Maternal care for unspecified type scar from previous cesarean delivery: Secondary | ICD-10-CM

## 2017-06-21 DIAGNOSIS — O24419 Gestational diabetes mellitus in pregnancy, unspecified control: Secondary | ICD-10-CM

## 2017-06-21 DIAGNOSIS — O09299 Supervision of pregnancy with other poor reproductive or obstetric history, unspecified trimester: Secondary | ICD-10-CM

## 2017-06-21 MED ORDER — MICONAZOLE NITRATE 2 % VA CREA: 1.0000 | TOPICAL_CREAM | Freq: Every day | VAGINAL | 0 refills | Status: DC

## 2017-06-21 MED ORDER — METFORMIN HCL 500 MG PO TABS: 1000.0000 mg | ORAL_TABLET | Freq: Two times a day (BID) | ORAL | 5 refills | Status: DC

## 2017-06-21 MED ORDER — METRONIDAZOLE 500 MG PO TABS: 500.0000 mg | ORAL_TABLET | Freq: Two times a day (BID) | ORAL | 0 refills | Status: DC

## 2017-06-21 NOTE — Progress Notes (Signed)
Patient states she never picked up medication for BV and yeast infection. Will refill so that pharmacy has current script for her. Armandina StammerJennifer Jazziel Fitzsimmons RNBSN

## 2017-06-21 NOTE — Progress Notes (Signed)
   PRENATAL VISIT NOTE  Subjective:  Gina Garza is a 32 y.o. G2P1001 at 6539w6d being seen today for ongoing prenatal care.  She is currently monitored for the following issues for this high-risk pregnancy and has Supervision of other normal pregnancy, antepartum; Morbid obesity (HCC); History of pre-eclampsia in prior pregnancy, currently pregnant; History of cesarean section complicating pregnancy; Vitamin D deficiency; Rubella non-immune status, antepartum; and Gestational diabetes mellitus (GDM) affecting pregnancy on their problem list.  Patient reports no complaints.  Contractions: Not present. Vag. Bleeding: None.  Movement: Present. Denies leaking of fluid.   The following portions of the patient's history were reviewed and updated as appropriate: allergies, current medications, past family history, past medical history, past social history, past surgical history and problem list. Problem list updated.  FBS:  116.115.116.127.123.106.114.118.110.106.128 B:   99.100.98.86.101.99.100.90 L:  120.96.101.114.111.120.101.105.99 D:  150.122.130.139.148.107.115.128.115.116.125  Objective:   Vitals:   06/21/17 0812  BP: 108/69  Pulse: 98  Weight: 280 lb (127 kg)    Fetal Status: Fetal Heart Rate (bpm): 144   Movement: Present     General:  Alert, oriented and cooperative. Patient is in no acute distress.  Skin: Skin is warm and dry. No rash noted.   Cardiovascular: Normal heart rate noted  Respiratory: Normal respiratory effort, no problems with respiration noted  Abdomen: Soft, gravid, appropriate for gestational age.  Pain/Pressure: Absent     Pelvic: Cervical exam deferred        Extremities: Normal range of motion.  Edema: None  Mental Status:  Normal mood and affect. Normal behavior. Normal judgment and thought content.   Assessment and Plan:  Pregnancy: G2P1001 at 4839w6d Patient Active Problem List   Diagnosis Date Noted  . Gestational diabetes mellitus (GDM) affecting  pregnancy 06/08/2017  . Vitamin D deficiency 05/17/2017  . Rubella non-immune status, antepartum 05/17/2017  . Supervision of other normal pregnancy, antepartum 05/04/2017  . Morbid obesity (HCC) 05/04/2017  . History of pre-eclampsia in prior pregnancy, currently pregnant 05/04/2017  . History of cesarean section complicating pregnancy 05/04/2017   States Dr Erin FullingHarraway-Smith told her goal was 140 for evening meal.  RD told her 120. Eats more carbs at dinner, discussed meds and modification. Consult Dr Jolayne Pantheronstant, will start metformin 1000mg  bid  Has f/u growth US scheduled  Preterm labor symptoms and general obstetric precautions including but not limited to vaginal bleeding, contractions, leaking of fluid and fetal movement were reviewed in detail with the patient. Please refer to After Visit Summary for other counseling recommendations.   RTO 2-3 wks to see how she is doing on meds    Wynelle BourgeoisMarie Williams, CNM

## 2017-06-21 NOTE — Patient Instructions (Signed)

## 2017-07-12 ENCOUNTER — Ambulatory Visit (INDEPENDENT_AMBULATORY_CARE_PROVIDER_SITE_OTHER): Payer: Medicaid Other | Admitting: Advanced Practice Midwife

## 2017-07-12 ENCOUNTER — Encounter: Payer: Self-pay | Admitting: Advanced Practice Midwife

## 2017-07-12 DIAGNOSIS — Z3480 Encounter for supervision of other normal pregnancy, unspecified trimester: Secondary | ICD-10-CM

## 2017-07-12 DIAGNOSIS — O24419 Gestational diabetes mellitus in pregnancy, unspecified control: Secondary | ICD-10-CM

## 2017-07-12 MED ORDER — GLYBURIDE 2.5 MG PO TABS: 2.5000 mg | ORAL_TABLET | Freq: Two times a day (BID) | ORAL | 3 refills | Status: DC

## 2017-07-12 NOTE — Progress Notes (Signed)
   PRENATAL VISIT NOTE  Subjective:  Gina Garza is a 32 y.o. G2P1001 at 3262w6d being seen today for ongoing prenatal care.  She is currently monitored for the following issues for this high-risk pregnancy and has Supervision of other normal pregnancy, antepartum; Morbid obesity (HCC); History of pre-eclampsia in prior pregnancy, currently pregnant; History of cesarean section complicating pregnancy; Vitamin D deficiency; Rubella non-immune status, antepartum; and Gestational diabetes mellitus (GDM) affecting pregnancy on their problem list.  Patient reports diarrhea from the metformin.  Contractions: Not present. Vag. Bleeding: None.  Movement: Present. Denies leaking of fluid.   The following portions of the patient's history were reviewed and updated as appropriate: allergies, current medications, past family history, past medical history, past social history, past surgical history and problem list. Problem list updated.  Objective:   Vitals:   07/12/17 0826  BP: 130/80  Pulse: (!) 101  Weight: 282 lb (127.9 kg)    Fetal Status: Fetal Heart Rate (bpm): 150 Fundal Height: 31 cm Movement: Present     General:  Alert, oriented and cooperative. Patient is in no acute distress.  Skin: Skin is warm and dry. No rash noted.   Cardiovascular: Normal heart rate noted  Respiratory: Normal respiratory effort, no problems with respiration noted  Abdomen: Soft, gravid, appropriate for gestational age.  Pain/Pressure: Absent     Pelvic: Cervical exam deferred        Extremities: Normal range of motion.  Edema: None  Mental Status:  Normal mood and affect. Normal behavior. Normal judgment and thought content.   Assessment and Plan:  Pregnancy: G2P1001 at 5862w6d  1. Supervision of other normal pregnancy, antepartum - CBC - RPR - HIV antibody (with reflex) - US Fetal Echocardiography; Future  2. Gestational diabetes mellitus (GDM) affecting pregnancy  - US Fetal Echocardiography; Future -  S>D today, MFM FU US scheduled for 07/25/17  - Forgot BG log today. She reports that her fasting BG have been under 100. The first week on the metformin they were still around 100-105. - She reports that her PP BG have all been 120.  - However, she reports GI side effects from the metformin, and cannot tolerate it. She is requesting to change to a new medication.  - Will change to glyburide 2.5mg  BID, and reassess at next visit.    Preterm labor symptoms and general obstetric precautions including but not limited to vaginal bleeding, contractions, leaking of fluid and fetal movement were reviewed in detail with the patient. Please refer to After Visit Summary for other counseling recommendations.  Return in about 2 weeks (around 07/26/2017).   Thressa ShellerHeather Suzzane Quilter, CNM

## 2017-07-13 LAB — CBC
Hematocrit: 32.7 % — ABNORMAL LOW (ref 34.0–46.6)
Hemoglobin: 10.9 g/dL — ABNORMAL LOW (ref 11.1–15.9)
MCH: 26.6 pg (ref 26.6–33.0)
MCHC: 33.3 g/dL (ref 31.5–35.7)
MCV: 80 fL (ref 79–97)
Platelets: 276 10*3/uL (ref 150–379)
RBC: 4.1 x10E6/uL (ref 3.77–5.28)
RDW: 14.4 % (ref 12.3–15.4)
WBC: 11.5 10*3/uL — AB (ref 3.4–10.8)

## 2017-07-13 LAB — HIV ANTIBODY (ROUTINE TESTING W REFLEX): HIV Screen 4th Generation wRfx: NONREACTIVE

## 2017-07-13 LAB — RPR: RPR: NONREACTIVE

## 2017-07-16 ENCOUNTER — Other Ambulatory Visit: Payer: Self-pay | Admitting: Advanced Practice Midwife

## 2017-07-16 DIAGNOSIS — Z3480 Encounter for supervision of other normal pregnancy, unspecified trimester: Secondary | ICD-10-CM

## 2017-07-25 ENCOUNTER — Ambulatory Visit (HOSPITAL_COMMUNITY)
Admission: RE | Admit: 2017-07-25 | Discharge: 2017-07-25 | Disposition: A | Discharge status: Home / Self Care | Payer: Medicaid Other | Source: Ambulatory Visit | Attending: Family Medicine | Admitting: Family Medicine

## 2017-07-25 ENCOUNTER — Encounter (HOSPITAL_COMMUNITY): Payer: Self-pay

## 2017-07-25 DIAGNOSIS — Z362 Encounter for other antenatal screening follow-up: Secondary | ICD-10-CM | POA: Insufficient documentation

## 2017-07-25 DIAGNOSIS — O24415 Gestational diabetes mellitus in pregnancy, controlled by oral hypoglycemic drugs: Secondary | ICD-10-CM | POA: Diagnosis present

## 2017-07-25 DIAGNOSIS — O24419 Gestational diabetes mellitus in pregnancy, unspecified control: Secondary | ICD-10-CM

## 2017-07-25 DIAGNOSIS — Z3A29 29 weeks gestation of pregnancy: Secondary | ICD-10-CM | POA: Insufficient documentation

## 2017-07-25 DIAGNOSIS — O34219 Maternal care for unspecified type scar from previous cesarean delivery: Secondary | ICD-10-CM | POA: Diagnosis not present

## 2017-07-25 DIAGNOSIS — O09893 Supervision of other high risk pregnancies, third trimester: Secondary | ICD-10-CM | POA: Insufficient documentation

## 2017-07-25 DIAGNOSIS — E669 Obesity, unspecified: Secondary | ICD-10-CM | POA: Insufficient documentation

## 2017-07-25 DIAGNOSIS — O99213 Obesity complicating pregnancy, third trimester: Secondary | ICD-10-CM | POA: Insufficient documentation

## 2017-07-26 ENCOUNTER — Ambulatory Visit (INDEPENDENT_AMBULATORY_CARE_PROVIDER_SITE_OTHER): Payer: Medicaid Other

## 2017-07-26 VITALS — BP 127/82 | HR 106 | Wt 282.0 lb

## 2017-07-26 DIAGNOSIS — Z3482 Encounter for supervision of other normal pregnancy, second trimester: Secondary | ICD-10-CM

## 2017-07-26 DIAGNOSIS — Z3480 Encounter for supervision of other normal pregnancy, unspecified trimester: Secondary | ICD-10-CM

## 2017-07-26 DIAGNOSIS — O24419 Gestational diabetes mellitus in pregnancy, unspecified control: Secondary | ICD-10-CM

## 2017-07-26 NOTE — Progress Notes (Signed)
   PRENATAL VISIT NOTE  Subjective:  Gina Garza is a 32 y.o. G2P1001 at 7270w6d being seen today for ongoing prenatal care.  She is currently monitored for the following issues for this high-risk pregnancy and has Supervision of other normal pregnancy, antepartum; Morbid obesity (HCC); History of pre-eclampsia in prior pregnancy, currently pregnant; History of cesarean section complicating pregnancy; Vitamin D deficiency; Rubella non-immune status, antepartum; and Gestational diabetes mellitus (GDM) affecting pregnancy on their problem list.  Patient reports no complaints.  Contractions: Not present. Vag. Bleeding: None.  Movement: Present. Denies leaking of fluid.   The following portions of the patient's history were reviewed and updated as appropriate: allergies, current medications, past family history, past medical history, past social history, past surgical history and problem list. Problem list updated.  Objective:   Vitals:   07/26/17 0949  BP: 127/82  Pulse: (!) 106  Weight: 282 lb (127.9 kg)    Fetal Status:   Fundal Height: 33 cm Movement: Present     General:  Alert, oriented and cooperative. Patient is in no acute distress.  Skin: Skin is warm and dry. No rash noted.   Cardiovascular: Normal heart rate noted  Respiratory: Normal respiratory effort, no problems with respiration noted  Abdomen: Soft, gravid, appropriate for gestational age.  Pain/Pressure: Absent     Pelvic: Cervical exam deferred        Extremities: Normal range of motion.  Edema: None  Mental Status:  Normal mood and affect. Normal behavior. Normal judgment and thought content.   Assessment and Plan:  Pregnancy: G2P1001 at 8570w6d  1. Supervision of other normal pregnancy, antepartum - No complaints. Routine care  2. Gestational diabetes mellitus (GDM) affecting pregnancy - All fasting CBGs in 100-110s. All postprandial CBGs less than 120. Consulted with Dr. Jolayne Pantheronstant and will increase glyburide to  2.5mg  in AM and 5mg  qHS - Will start 2x weekly testing in 2 weeks  -MFM recommended growth u/s every 4 weeks. EFW 81% on 3/18 with AC 90% - US MFM OB FOLLOW UP; Future  Preterm labor symptoms and general obstetric precautions including but not limited to vaginal bleeding, contractions, leaking of fluid and fetal movement were reviewed in detail with the patient. Please refer to After Visit Summary for other counseling recommendations.  Return in about 2 weeks (around 08/09/2017) for Return OB visit and NST.   Rolm BookbinderCaroline M Khala Tarte, CNM  07/26/17 10:18 AM

## 2017-07-26 NOTE — Patient Instructions (Addendum)
Safe Medications in Pregnancy   Acne: Benzoyl Peroxide Salicylic Acid  Backache/Headache: Tylenol: 2 regular strength every 4 hours OR              2 Extra strength every 6 hours  Colds/Coughs/Allergies: Benadryl (alcohol free) 25 mg every 6 hours as needed Breath right strips Claritin Cepacol throat lozenges Chloraseptic throat spray Cold-Eeze- up to three times per day Cough drops, alcohol free Flonase (by prescription only) Guaifenesin Mucinex Robitussin DM (plain only, alcohol free) Saline nasal spray/drops Sudafed (pseudoephedrine) & Actifed ** use only after [redacted] weeks gestation and if you do not have high blood pressure Tylenol Vicks Vaporub Zinc lozenges Zyrtec   Constipation: Colace Ducolax suppositories Fleet enema Glycerin suppositories Metamucil Milk of magnesia Miralax Senokot Smooth move tea  Diarrhea: Kaopectate Imodium A-D  *NO pepto Bismol  Hemorrhoids: Anusol Anusol HC Preparation H Tucks  Indigestion: Tums Maalox Mylanta Zantac  Pepcid  Insomnia: Benadryl (alcohol free) 25mg  every 6 hours as needed Tylenol PM Unisom, no Gelcaps  Leg Cramps: Tums MagGel  Nausea/Vomiting:  Bonine Dramamine Emetrol Ginger extract Sea bands Meclizine  Nausea medication to take during pregnancy:  Unisom (doxylamine succinate 25 mg tablets) Take one tablet daily at bedtime. If symptoms are not adequately controlled, the dose can be increased to a maximum recommended dose of two tablets daily (1/2 tablet in the morning, 1/2 tablet mid-afternoon and one at bedtime). Vitamin B6 100mg  tablets. Take one tablet twice a day (up to 200 mg per day).  Skin Rashes: Aveeno products Benadryl cream or 25mg  every 6 hours as needed Calamine Lotion 1% cortisone cream  Yeast infection: Gyne-lotrimin 7 Monistat 7   **If taking multiple medications, please check labels to avoid duplicating the same active ingredients **take medication as directed on  the label ** Do not exceed 4000 mg of tylenol in 24 hours **Do not take medications that contain aspirin or ibuprofen     Nonstress Test The nonstress test is a procedure that monitors the fetus's heartbeat. The test will monitor the heartbeat when the fetus is at rest and while the fetus is moving. In a healthy fetus, there will be an increase in fetal heart rate when the fetus moves or kicks. The heart rate will decrease at rest. This test helps determine if the fetus is healthy. Your health care provider will look at a number of patterns in the heart rate tracing to make sure your baby is thriving. If there is concern, your health care provider may order additional tests or may suggest another course of action. This test is often done in the third trimester and can help determine if an early delivery is needed and safe. Common reasons to have this test are:  You are past your due date.  You have a high-risk pregnancy.  You are feeling less movement than normal.  You have lost a pregnancy in the past.  Your health care provider suspects fetal growth problems.  You have too much or too little amniotic fluid.  What happens before the procedure?  Eat a meal right before the test or as directed by your health care provider. Food may help stimulate fetal movements.  Use the restroom right before the test. What happens during the procedure?  Two belts will be placed around your abdomen. These belts have monitors attached to them. One records the fetal heart rate and the other records uterine contractions.  You may be asked to lie down on your side or to stay  sitting upright.  You may be given a button to press when you feel movement.  The fetal heartbeat is listened to and watched on a screen. The heartbeat is recorded on a sheet of paper.  If the fetus seems to be sleeping, you may be asked to drink some juice or soda, gently press your abdomen, or make some noise to wake the  fetus. What happens after the procedure? Your health care provider will discuss the test results with you and make recommendations for the near future.  This information is not intended to replace advice given to you by your health care provider. Make sure you discuss any questions you have with your health care provider. This information is not intended to replace advice given to you by your health care provider. Make sure you discuss any questions you have with your health care provider. Document Released: 04/16/2002 Document Revised: 03/26/2016 Document Reviewed: 05/30/2012 Elsevier Interactive Patient Education  2018 [employer].  Deciding about Circumcision in [place] Boys  (The Basics)  What is circumcision?  Circumcision is a surgery that removes the skin that covers the tip of the penis, called the "foreskin" Circumcision is usually done when a boy is between 81 and 83 days old. In the [place], circumcision is common. In some other countries, fewer boys are circumcised. Circumcision is a common tradition in some religions.  Should I have my baby boy circumcised?  There is no easy answer. Circumcision has some benefits. But it also has risks. After talking with your doctor, you will have to decide for yourself what is right for your family.  What are the benefits of circumcision?  Circumcised boys seem to have slightly lower rates of: ?Urinary tract infections ?Swelling of the opening at the tip of the penis Circumcised men seem to have slightly lower rates of: ?Urinary tract infections ?Swelling of the opening at the tip of the penis ?Penis cancer ?HIV and other infections that you catch during sex ?Cervical cancer in the women they have sex with Even so, in the [place], the risks of these problems are small - even in boys and men who have not been circumcised. Plus, boys and men who are not circumcised can reduce these extra risks by: ?Cleaning their penis  well ?Using condoms during sex  What are the risks of circumcision?  Risks include: ?Bleeding or infection from the surgery ?Damage to or amputation of the penis ?A chance that the doctor will cut off too much or not enough of the foreskin ?A chance that sex won't feel as good later in life Only about 1 out of every 200 circumcisions leads to problems. There is also a chance that your health insurance won't pay for circumcision.  How is circumcision done in baby boys?  First, the baby gets medicine for pain relief. This might be a cream on the skin or a shot into the base of the penis. Next, the doctor cleans the baby's penis well. Then he or she uses special tools to cut off the foreskin. Finally, the doctor wraps a bandage (called gauze) around the baby's penis. If you have your baby circumcised, his doctor or nurse will give you instructions on how to care for him after the surgery. It is important that you follow those instructions carefully.

## 2017-08-04 ENCOUNTER — Ambulatory Visit (INDEPENDENT_AMBULATORY_CARE_PROVIDER_SITE_OTHER): Payer: Medicaid Other | Admitting: *Deleted

## 2017-08-04 VITALS — BP 112/68 | HR 94 | Wt 280.0 lb

## 2017-08-04 DIAGNOSIS — O24419 Gestational diabetes mellitus in pregnancy, unspecified control: Secondary | ICD-10-CM | POA: Diagnosis not present

## 2017-08-11 ENCOUNTER — Encounter: Payer: Medicaid Other | Admitting: Family Medicine

## 2017-08-11 ENCOUNTER — Ambulatory Visit (INDEPENDENT_AMBULATORY_CARE_PROVIDER_SITE_OTHER): Payer: Medicaid Other | Admitting: Family Medicine

## 2017-08-11 ENCOUNTER — Encounter (HOSPITAL_COMMUNITY): Payer: Self-pay

## 2017-08-11 VITALS — BP 117/82 | HR 108 | Wt 280.0 lb

## 2017-08-11 DIAGNOSIS — O24419 Gestational diabetes mellitus in pregnancy, unspecified control: Secondary | ICD-10-CM

## 2017-08-11 DIAGNOSIS — Z3480 Encounter for supervision of other normal pregnancy, unspecified trimester: Secondary | ICD-10-CM

## 2017-08-11 DIAGNOSIS — O09899 Supervision of other high risk pregnancies, unspecified trimester: Secondary | ICD-10-CM

## 2017-08-11 DIAGNOSIS — Z283 Underimmunization status: Secondary | ICD-10-CM

## 2017-08-11 DIAGNOSIS — O9989 Other specified diseases and conditions complicating pregnancy, childbirth and the puerperium: Secondary | ICD-10-CM

## 2017-08-11 DIAGNOSIS — O34219 Maternal care for unspecified type scar from previous cesarean delivery: Secondary | ICD-10-CM

## 2017-08-11 DIAGNOSIS — O09299 Supervision of pregnancy with other poor reproductive or obstetric history, unspecified trimester: Secondary | ICD-10-CM

## 2017-08-11 DIAGNOSIS — O26893 Other specified pregnancy related conditions, third trimester: Secondary | ICD-10-CM

## 2017-08-11 DIAGNOSIS — R12 Heartburn: Secondary | ICD-10-CM

## 2017-08-11 MED ORDER — FAMOTIDINE 20 MG PO TABS: 20.0000 mg | ORAL_TABLET | Freq: Two times a day (BID) | ORAL | 3 refills | Status: DC

## 2017-08-11 NOTE — Progress Notes (Signed)
Subjective:  Gina Garza is a 32 y.o. G2P1001 at 65w1dbeing seen today for ongoing prenatal care.  She is currently monitored for the following issues for this high-risk pregnancy and has Supervision of other normal pregnancy, antepartum; [place]; History of pre-eclampsia in prior pregnancy, currently pregnant; History of cesarean section complicating pregnancy; Vitamin D deficiency; Rubella non-immune status, antepartum; and Gestational diabetes mellitus (GDM) affecting pregnancy on their problem list.  GDM: Patient taking glyburide 2.564min AM and 3m96mn PM.  Reports occasional hypoglycemic episodes.  Tolerating medication well Fasting: 2 elevated 2hr PP: controlled  Patient reports no complaints.  Contractions: Not present. Vag. Bleeding: None.  Movement: Present. Denies leaking of fluid.   The following portions of the patient's history were reviewed and updated as appropriate: allergies, current medications, past family history, past medical history, past social history, past surgical history and problem list. Problem list updated.  Objective:   Vitals:   08/11/17 0823  BP: 117/82  Pulse: (!) 108  Weight: 280 lb (127 kg)    Fetal Status: Fetal Heart Rate (bpm): NST   Movement: Present     General:  Alert, oriented and cooperative. Patient is in no acute distress.  Skin: Skin is warm and dry. No rash noted.   Cardiovascular: Normal heart rate noted  Respiratory: Normal respiratory effort, no problems with respiration noted  Abdomen: Soft, gravid, appropriate for gestational age. Pain/Pressure: Absent     Pelvic: Vag. Bleeding: None     Cervical exam deferred        Extremities: Normal range of motion.  Edema: Trace  Mental Status: Normal mood and affect. Normal behavior. Normal judgment and thought content.   Urinalysis:      Assessment and Plan:  Pregnancy: G2P1001 at 32w14w1d Supervision of other normal pregnancy, antepartum FHT normal  2. Gestational  diabetes mellitus (GDM) affecting pregnancy EFW 1802g Has follow up [place]. NST reactive Continue antenatal testing. CBGs mostly controlled - continue glyburide  3. History of pre-eclampsia in prior pregnancy, currently pregnant Continue ASA 81mg18m History of cesarean section complicating pregnancy RLTCS at 29 weeks - desires BTL with this.  5. Morbid obesity (HCC) Fordvilleght stable  6. Rubella non-immune status, antepartum MMR vaccine postpartum.  7. Heartburn in pregnancy in third trimester Pepcid prescribed  Preterm labor symptoms and general obstetric precautions including but not limited to vaginal bleeding, contractions, leaking of fluid and fetal movement were reviewed in detail with the patient. Please refer to After Visit Summary for other counseling recommendations.  No follow-ups on file.   StinsTruett Mainland

## 2017-08-15 ENCOUNTER — Other Ambulatory Visit: Payer: Medicaid Other

## 2017-08-15 ENCOUNTER — Ambulatory Visit (INDEPENDENT_AMBULATORY_CARE_PROVIDER_SITE_OTHER): Payer: Medicaid Other

## 2017-08-15 VITALS — BP 115/67 | HR 99

## 2017-08-15 DIAGNOSIS — O24419 Gestational diabetes mellitus in pregnancy, unspecified control: Secondary | ICD-10-CM

## 2017-08-15 NOTE — Progress Notes (Signed)
NST only visit. Bassel Gaskill RN 

## 2017-08-18 ENCOUNTER — Ambulatory Visit (INDEPENDENT_AMBULATORY_CARE_PROVIDER_SITE_OTHER): Payer: Medicaid Other | Admitting: Family Medicine

## 2017-08-18 VITALS — BP 118/70 | HR 100

## 2017-08-18 DIAGNOSIS — Z283 Underimmunization status: Secondary | ICD-10-CM

## 2017-08-18 DIAGNOSIS — O09299 Supervision of pregnancy with other poor reproductive or obstetric history, unspecified trimester: Secondary | ICD-10-CM

## 2017-08-18 DIAGNOSIS — O34219 Maternal care for unspecified type scar from previous cesarean delivery: Secondary | ICD-10-CM

## 2017-08-18 DIAGNOSIS — O24419 Gestational diabetes mellitus in pregnancy, unspecified control: Secondary | ICD-10-CM

## 2017-08-18 DIAGNOSIS — O09293 Supervision of pregnancy with other poor reproductive or obstetric history, third trimester: Secondary | ICD-10-CM

## 2017-08-18 DIAGNOSIS — O9989 Other specified diseases and conditions complicating pregnancy, childbirth and the puerperium: Secondary | ICD-10-CM

## 2017-08-18 DIAGNOSIS — M549 Dorsalgia, unspecified: Secondary | ICD-10-CM | POA: Diagnosis not present

## 2017-08-18 DIAGNOSIS — M9902 Segmental and somatic dysfunction of thoracic region: Secondary | ICD-10-CM

## 2017-08-18 DIAGNOSIS — Z3480 Encounter for supervision of other normal pregnancy, unspecified trimester: Secondary | ICD-10-CM

## 2017-08-18 DIAGNOSIS — Z2839 Other underimmunization status: Secondary | ICD-10-CM

## 2017-08-18 NOTE — Progress Notes (Signed)
Patient complaining of back pain and would like to see DR. Stinson today. Armandina StammerJennifer Angalina Ante RN

## 2017-08-18 NOTE — Progress Notes (Signed)
   PRENATAL VISIT NOTE  Subjective:  Gina Garza is a 32 y.o. G2P1001 at 51w1dbeing seen today for ongoing prenatal care.  She is currently monitored for the following issues for this high-risk pregnancy and has Supervision of other normal pregnancy, antepartum; [place]; History of pre-eclampsia in prior pregnancy, currently pregnant; History of cesarean section complicating pregnancy; Vitamin D deficiency; Rubella non-immune status, antepartum; and Gestational diabetes mellitus (GDM) affecting pregnancy on their problem list.  Patient reports back pain since earlier this week - mid to lower back..  Contractions: Irritability. Vag. Bleeding: None.  Movement: Present. Denies leaking of fluid.   GDM: Patient taking glyburide, metformin.  Reports no hypoglycemic episodes.  Tolerating medication well Fasting: controlled 2hr PP: controlled  The following portions of the patient's history were reviewed and updated as appropriate: allergies, current medications, past family history, past medical history, past social history, past surgical history and problem list. Problem list updated.  Objective:   Vitals:   08/18/17 0855  BP: 118/70  Pulse: 100    Fetal Status: Fetal Heart Rate (bpm): NST   Movement: Present     General:  Alert, oriented and cooperative. Patient is in no acute distress.  Skin: Skin is warm and dry. No rash noted.   Cardiovascular: Normal heart rate noted  Respiratory: Normal respiratory effort, no problems with respiration noted  Abdomen: Soft, gravid, appropriate for gestational age. Pain/Pressure: Present     Pelvic:  Cervical exam deferred        MSK: Restriction, tenderness, tissue texture changes, and paraspinal spasm in the lumbar, thoracic spine  Neuro: Moves all four extremities with no focal neurological deficit  Extremities: Normal range of motion.  Edema: Trace  Mental Status: Normal mood and affect. Normal behavior. Normal judgment and thought  content.   OSE: Head   Cervical   Thoracic T8 FSRR  Rib Rib 8 inhaled  Lumbar L1 ESRR, L5 ESRL  Sacrum L/L  Pelvis Right ant    Assessment and Plan:  Pregnancy: G2P1001 at 345w1d1. Supervision of other normal pregnancy, antepartum FHT normal  2. Gestational diabetes mellitus (GDM) affecting pregnancy Controlled NST reactive  3. History of pre-eclampsia in prior pregnancy, currently pregnant Continue ASA 8167m4. History of cesarean section complicating pregnancy Repeat c/s  5. Rubella non-immune status, antepartum MMR post delivery  6. Back pain affecting pregnancy in third trimester 7. Somatic dysfunction of thoracic region OMT done after patient permission. HVLA technique utilized. 5 areas treated with improvement of tissue texture and joint mobility. Patient tolerated procedure well.    Preterm labor symptoms and general obstetric precautions including but not limited to vaginal bleeding, contractions, leaking of fluid and fetal movement were reviewed in detail with the patient. Please refer to After Visit Summary for other counseling recommendations.  No follow-ups on file.  StiTruett MainlandO

## 2017-08-22 ENCOUNTER — Ambulatory Visit (INDEPENDENT_AMBULATORY_CARE_PROVIDER_SITE_OTHER): Payer: Medicaid Other

## 2017-08-22 ENCOUNTER — Encounter (HOSPITAL_COMMUNITY): Payer: Self-pay

## 2017-08-22 ENCOUNTER — Inpatient Hospital Stay (HOSPITAL_COMMUNITY)
Admission: AD | Admit: 2017-08-22 | Discharge: 2017-08-22 | Disposition: A | Discharge status: Home / Self Care | Payer: Medicaid Other | Source: Ambulatory Visit | Attending: Obstetrics & Gynecology | Admitting: Obstetrics & Gynecology

## 2017-08-22 ENCOUNTER — Telehealth: Payer: Self-pay

## 2017-08-22 VITALS — BP 118/68 | HR 99 | Wt 280.0 lb

## 2017-08-22 DIAGNOSIS — H532 Diplopia: Secondary | ICD-10-CM | POA: Diagnosis not present

## 2017-08-22 DIAGNOSIS — Z7984 Long term (current) use of oral hypoglycemic drugs: Secondary | ICD-10-CM | POA: Diagnosis not present

## 2017-08-22 DIAGNOSIS — Z79899 Other long term (current) drug therapy: Secondary | ICD-10-CM | POA: Insufficient documentation

## 2017-08-22 DIAGNOSIS — R519 Headache, unspecified: Secondary | ICD-10-CM

## 2017-08-22 DIAGNOSIS — R42 Dizziness and giddiness: Secondary | ICD-10-CM | POA: Insufficient documentation

## 2017-08-22 DIAGNOSIS — O24419 Gestational diabetes mellitus in pregnancy, unspecified control: Secondary | ICD-10-CM

## 2017-08-22 DIAGNOSIS — Z9889 Other specified postprocedural states: Secondary | ICD-10-CM | POA: Insufficient documentation

## 2017-08-22 DIAGNOSIS — Z823 Family history of stroke: Secondary | ICD-10-CM | POA: Diagnosis not present

## 2017-08-22 DIAGNOSIS — O26893 Other specified pregnancy related conditions, third trimester: Secondary | ICD-10-CM | POA: Insufficient documentation

## 2017-08-22 DIAGNOSIS — O212 Late vomiting of pregnancy: Secondary | ICD-10-CM | POA: Insufficient documentation

## 2017-08-22 DIAGNOSIS — R51 Headache: Secondary | ICD-10-CM | POA: Diagnosis not present

## 2017-08-22 DIAGNOSIS — O99213 Obesity complicating pregnancy, third trimester: Secondary | ICD-10-CM | POA: Insufficient documentation

## 2017-08-22 DIAGNOSIS — Z3A33 33 weeks gestation of pregnancy: Secondary | ICD-10-CM | POA: Insufficient documentation

## 2017-08-22 DIAGNOSIS — Z8249 Family history of ischemic heart disease and other diseases of the circulatory system: Secondary | ICD-10-CM | POA: Insufficient documentation

## 2017-08-22 DIAGNOSIS — O219 Vomiting of pregnancy, unspecified: Secondary | ICD-10-CM

## 2017-08-22 LAB — CBC
HEMATOCRIT: 30.9 % — AB (ref 36.0–46.0)
Hemoglobin: 10.4 g/dL — ABNORMAL LOW (ref 12.0–15.0)
MCH: 26.5 pg (ref 26.0–34.0)
MCHC: 33.7 g/dL (ref 30.0–36.0)
MCV: 78.8 fL (ref 78.0–100.0)
PLATELETS: 291 10*3/uL (ref 150–400)
RBC: 3.92 MIL/uL (ref 3.87–5.11)
RDW: 13.7 % (ref 11.5–15.5)
WBC: 13.4 10*3/uL — AB (ref 4.0–10.5)

## 2017-08-22 LAB — URINALYSIS, ROUTINE W REFLEX MICROSCOPIC
BILIRUBIN URINE: NEGATIVE
GLUCOSE, UA: NEGATIVE mg/dL
HGB URINE DIPSTICK: NEGATIVE
KETONES UR: NEGATIVE mg/dL
NITRITE: NEGATIVE
PROTEIN: NEGATIVE mg/dL
Specific Gravity, Urine: 1.014 (ref 1.005–1.030)
pH: 7 (ref 5.0–8.0)

## 2017-08-22 LAB — COMPREHENSIVE METABOLIC PANEL
ALBUMIN: 2.9 g/dL — AB (ref 3.5–5.0)
ALT: 6 U/L — ABNORMAL LOW (ref 14–54)
AST: 15 U/L (ref 15–41)
Alkaline Phosphatase: 79 U/L (ref 38–126)
Anion gap: 11 (ref 5–15)
BILIRUBIN TOTAL: 0.4 mg/dL (ref 0.3–1.2)
BUN: 6 mg/dL (ref 6–20)
CHLORIDE: 103 mmol/L (ref 101–111)
CO2: 21 mmol/L — ABNORMAL LOW (ref 22–32)
Calcium: 8.7 mg/dL — ABNORMAL LOW (ref 8.9–10.3)
Creatinine, Ser: 0.47 mg/dL (ref 0.44–1.00)
GFR calc Af Amer: >60 mL/min (ref >60)
GFR calc non Af Amer: >60 mL/min (ref >60)
GLUCOSE: 81 mg/dL (ref 65–99)
POTASSIUM: 3.6 mmol/L (ref 3.5–5.1)
SODIUM: 135 mmol/L (ref 135–145)
TOTAL PROTEIN: 6.5 g/dL (ref 6.5–8.1)

## 2017-08-22 MED ORDER — METOCLOPRAMIDE HCL 5 MG/ML IJ SOLN
10.0000 mg | Freq: Once | INTRAMUSCULAR | Status: AC
  Administered 2017-08-22: 10 mg via INTRAVENOUS
  Filled 2017-08-22: qty 2

## 2017-08-22 MED ORDER — ONDANSETRON 4 MG PO TBDP: 4.0000 mg | ORAL_TABLET | Freq: Three times a day (TID) | ORAL | 0 refills | Status: DC | PRN

## 2017-08-22 MED ORDER — DEXAMETHASONE SODIUM PHOSPHATE 10 MG/ML IJ SOLN
10.0000 mg | Freq: Once | INTRAMUSCULAR | Status: AC
  Administered 2017-08-22: 10 mg via INTRAVENOUS
  Filled 2017-08-22: qty 1

## 2017-08-22 MED ORDER — DIPHENHYDRAMINE HCL 50 MG/ML IJ SOLN
25.0000 mg | Freq: Once | INTRAMUSCULAR | Status: AC
  Administered 2017-08-22: 25 mg via INTRAVENOUS
  Filled 2017-08-22: qty 1

## 2017-08-22 MED ORDER — SODIUM CHLORIDE 0.9 % IV SOLN
INTRAVENOUS | Status: DC
  Administered 2017-08-22: 19:00:00 via INTRAVENOUS

## 2017-08-22 NOTE — Progress Notes (Signed)
I have reviewed this chart and agree with the RN/CMA assessment and management.    K. Meryl Paije Goodhart, M.D. Attending Obstetrician & Gynecologist, [facility], [facility]  

## 2017-08-22 NOTE — MAU Provider Note (Signed)
History     CSN: [id number]  Arrival date and time: 08/22/17 1659   First Provider Initiated Contact with Patient 08/22/17 1823      Chief Complaint  Patient presents with  . Emesis  . Dizziness   HPI  Ms.  Gina Garza is a 32 y.o. year old G82P1001 female at 25w5dweeks gestation who presents to MAU via EMS reporting "feeling bad", vomiting since last night, had an episode of dizziness and double vision while at work. She had to leave work to come here. She also reports a H/A that she rates a 7/10 and localized over RT eye. She denies CP, SOB, UC's, VB, LOF, or diarrhea. She reports good (+) FM today. Her S.O. Reports he and his son (that comes stay with them sometimes ) had the stomach bug and he wonders if it's that?  Past Medical History:  Diagnosis Date  . Gestational diabetes   . Morbid obesity with body mass index (BMI) of 40.0 or higher ([place]     Past Surgical History:  Procedure Laterality Date  . CESAREAN SECTION    . Cysts removed from chest and under arms      Family History  Problem Relation Age of Onset  . Hypertension Mother   . Stroke Maternal Grandmother   . Cancer Neg Hx     Social History   Tobacco Use  . Smoking status: Never Smoker  . Smokeless tobacco: Never Used  Substance Use Topics  . Alcohol use: No    Frequency: Never  . Drug use: No    Allergies: No Known Allergies  Medications Prior to Admission  Medication Sig Dispense Refill Last Dose  . ACCU-CHEK FASTCLIX LANCETS MISC 1 Units by Percutaneous route 4 (four) times daily. 100 each 12 Taking  . Blood Glucose Monitoring Suppl (ACCU-CHEK NANO SMARTVIEW) w/Device KIT 1 kit by Subdermal route as directed. Check blood sugars for fasting, and two hours after breakfast, lunch and dinner (4 checks daily) 1 kit 0 Taking  . calcium carbonate (TUMS - DOSED IN MG ELEMENTAL CALCIUM) 500 MG chewable tablet Chew 2 tablets by mouth 2 (two) times daily as needed for indigestion or heartburn.    Taking  . famotidine (PEPCID) 20 MG tablet Take 1 tablet (20 mg total) by mouth 2 (two) times daily. 60 tablet 3   . glucose blood (ACCU-CHEK SMARTVIEW) test strip Use as instructed to check blood sugars 100 each 12 Taking  . glyBURIDE (DIABETA) 2.5 MG tablet Take 1 tablet (2.5 mg total) by mouth 2 (two) times daily with a meal. 60 tablet 3 Taking  . metFORMIN (GLUCOPHAGE) 500 MG tablet Take 2 tablets (1,000 mg total) by mouth 2 (two) times daily with a meal. 60 tablet 5 Taking  . metroNIDAZOLE (FLAGYL) 500 MG tablet Take 1 tablet (500 mg total) by mouth 2 (two) times daily. 14 tablet 0 Taking  . miconazole (MONISTAT 7) 2 % vaginal cream Place 1 Applicatorful vaginally at bedtime. Apply for seven nights 30 g 0 Taking  . Prenatal Vit-Fe Fumarate-FA (MULTIVITAMIN-PRENATAL) 27-0.8 MG TABS tablet Take 1 tablet by mouth daily at 12 noon.   Taking  . Vitamin D, Ergocalciferol, (DRISDOL) 50000 units CAPS capsule Take 1 capsule (50,000 Units total) by mouth every 7 (seven) days. 20 capsule 0 Taking    Review of Systems  Constitutional: Negative.   HENT: Negative.   Eyes: Negative.   Respiratory: Negative.   Cardiovascular: Negative.   Gastrointestinal: Positive for nausea and vomiting.  Negative for diarrhea.  Endocrine: Negative.   Musculoskeletal: Negative.   Skin: Negative.   Allergic/Immunologic: Negative.   Neurological: Positive for dizziness, light-headedness and headaches (7/10; "over RT eye").  Hematological: Negative.   Psychiatric/Behavioral: Negative.    Physical Exam   Blood pressure 116/86, pulse 98, temperature 99 F (37.2 C), temperature source Oral, resp. rate 18, height '5\' 8"'  (1.727 m), weight 280 lb (127 kg).  Physical Exam  Constitutional: She is oriented to person, place, and time. She appears well-developed and well-nourished.  HENT:  Head: Normocephalic and atraumatic.  Eyes: Pupils are equal, round, and reactive to light. Conjunctivae and EOM are normal.  Neck:  Normal range of motion. Neck supple.  Cardiovascular: Normal rate, regular rhythm, normal heart sounds and intact distal pulses.  Respiratory: Effort normal and breath sounds normal.  GI: Soft. Bowel sounds are normal.  Genitourinary:  Genitourinary Comments: pelvic deferred  Musculoskeletal: Normal range of motion.  Neurological: She is alert and oriented to person, place, and time. She has normal reflexes.  Skin: Skin is warm and dry.  Psychiatric: She has a normal mood and affect. Her behavior is normal. Judgment and thought content normal.    MAU Course  Procedures  MDM CCUA H/A Protocol -- improved "feels so much better" NST - FHR: 125 bpm / moderate variability / accels present / decels absent / TOCO: none  Results for orders placed or performed during the hospital encounter of 08/22/17 (from the past 24 hour(s))  Urinalysis, Routine w reflex microscopic     Status: Abnormal   Collection Time: 08/22/17  5:24 PM  Result Value Ref Range   Color, Urine YELLOW YELLOW   APPearance HAZY (A) CLEAR   Specific Gravity, Urine 1.014 1.005 - 1.030   pH 7.0 5.0 - 8.0   Glucose, UA NEGATIVE NEGATIVE mg/dL   Hgb urine dipstick NEGATIVE NEGATIVE   Bilirubin Urine NEGATIVE NEGATIVE   Ketones, ur NEGATIVE NEGATIVE mg/dL   Protein, ur NEGATIVE NEGATIVE mg/dL   Nitrite NEGATIVE NEGATIVE   Leukocytes, UA MODERATE (A) NEGATIVE   RBC / HPF 0-5 0 - 5 RBC/hpf   WBC, UA 6-30 0 - 5 WBC/hpf   Bacteria, UA MANY (A) NONE SEEN   Squamous Epithelial / LPF 0-5 (A) NONE SEEN   Mucus PRESENT   CBC     Status: Abnormal   Collection Time: 08/22/17  6:46 PM  Result Value Ref Range   WBC 13.4 (H) 4.0 - 10.5 K/uL   RBC 3.92 3.87 - 5.11 MIL/uL   Hemoglobin 10.4 (L) 12.0 - 15.0 g/dL   HCT 30.9 (L) 36.0 - 46.0 %   MCV 78.8 78.0 - 100.0 fL   MCH 26.5 26.0 - 34.0 pg   MCHC 33.7 30.0 - 36.0 g/dL   RDW 13.7 11.5 - 15.5 %   Platelets 291 150 - 400 K/uL  Comprehensive metabolic panel     Status: Abnormal    Collection Time: 08/22/17  6:46 PM  Result Value Ref Range   Sodium 135 135 - 145 mmol/L   Potassium 3.6 3.5 - 5.1 mmol/L   Chloride 103 101 - 111 mmol/L   CO2 21 (L) 22 - 32 mmol/L   Glucose, Bld 81 65 - 99 mg/dL   BUN 6 6 - 20 mg/dL   Creatinine, Ser 0.47 0.44 - 1.00 mg/dL   Calcium 8.7 (L) 8.9 - 10.3 mg/dL   Total Protein 6.5 6.5 - 8.1 g/dL   Albumin 2.9 (L) 3.5 -  5.0 g/dL   AST 15 15 - 41 U/L   ALT 6 (L) 14 - 54 U/L   Alkaline Phosphatase 79 38 - 126 U/L   Total Bilirubin 0.4 0.3 - 1.2 mg/dL   GFR calc non Af Amer >60 >60 mL/min   GFR calc Af Amer >60 >60 mL/min   Anion gap 11 5 - 15    Assessment and Plan  Acute nonintractable headache, unspecified headache type  - Information provided on H/A - Advised to take Tylenol 1000 mg every 6 hs prn  Nausea/vomiting in pregnancy  - Rx for Zofran 4 mg ODT every 8 hrs prn N/V - Information provided on N/V I npregnancy  - Discharge patient - Patient verbalized an understanding of the plan of care and agrees.   Laury Deep, MSN, CNM 08/22/2017, 6:33 PM

## 2017-08-22 NOTE — Discharge Instructions (Signed)
You have a prescription for Zofran that was transcribed to [facility] on [address]

## 2017-08-22 NOTE — Progress Notes (Signed)
NST visit only. Armandina StammerJennifer Howard RN

## 2017-08-22 NOTE — MAU Note (Signed)
Pt came in by EMS, pt woke up "feeling bad," has been vomiting since last night, became very dizzy @ work, had double vision.  Denies pain, bleeding or LOF.  Reports good fetal movement.

## 2017-08-22 NOTE — Telephone Encounter (Signed)
Patient called tearful stating that she hasnt been feeling well today since leaving the office this morning. She states she feels really dizzy and has been nauseated all day.   Patient [facility] or to call 911 to go straight to the hospital. Patient states understanding and has a coworker to call for ambulance. Armandina StammerJennifer Howard RN

## 2017-08-25 ENCOUNTER — Ambulatory Visit (INDEPENDENT_AMBULATORY_CARE_PROVIDER_SITE_OTHER): Payer: Medicaid Other | Admitting: Obstetrics & Gynecology

## 2017-08-25 ENCOUNTER — Telehealth: Payer: Self-pay

## 2017-08-25 VITALS — BP 119/72 | HR 98 | Wt 280.0 lb

## 2017-08-25 DIAGNOSIS — Z283 Underimmunization status: Secondary | ICD-10-CM

## 2017-08-25 DIAGNOSIS — O24419 Gestational diabetes mellitus in pregnancy, unspecified control: Secondary | ICD-10-CM | POA: Diagnosis not present

## 2017-08-25 DIAGNOSIS — O34219 Maternal care for unspecified type scar from previous cesarean delivery: Secondary | ICD-10-CM

## 2017-08-25 DIAGNOSIS — O9989 Other specified diseases and conditions complicating pregnancy, childbirth and the puerperium: Secondary | ICD-10-CM

## 2017-08-25 DIAGNOSIS — Z2839 Other underimmunization status: Secondary | ICD-10-CM

## 2017-08-25 DIAGNOSIS — Z3480 Encounter for supervision of other normal pregnancy, unspecified trimester: Secondary | ICD-10-CM

## 2017-08-25 DIAGNOSIS — O09293 Supervision of pregnancy with other poor reproductive or obstetric history, third trimester: Secondary | ICD-10-CM

## 2017-08-25 DIAGNOSIS — O09299 Supervision of pregnancy with other poor reproductive or obstetric history, unspecified trimester: Secondary | ICD-10-CM

## 2017-08-25 DIAGNOSIS — O219 Vomiting of pregnancy, unspecified: Secondary | ICD-10-CM

## 2017-08-25 NOTE — Telephone Encounter (Signed)
Left message for patient to return call to office to gather recent blood sugars. Armandina StammerJennifer Britton Perkinson RN

## 2017-08-25 NOTE — Progress Notes (Signed)
   PRENATAL VISIT NOTE  Subjective:  Gina Garza is a 32 y.o. G2P1001 at 62w1dbeing seen today for ongoing prenatal care.  She is currently monitored for the following issues for this high-risk pregnancy and has Supervision of other normal pregnancy, antepartum; Morbid obesity ([place]; History of pre-eclampsia in prior pregnancy, currently pregnant; History of cesarean section complicating pregnancy; Vitamin D deficiency; Rubella non-immune status, antepartum; Gestational diabetes mellitus (GDM) affecting pregnancy; Headache; and Nausea/vomiting in pregnancy on their problem list.  Patient reports no complaints.  Contractions: Not present. Vag. Bleeding: None.  Movement: Present. Denies leaking of fluid.   The following portions of the patient's history were reviewed and updated as appropriate: allergies, current medications, past family history, past medical history, past social history, past surgical history and problem list. Problem list updated.  Objective:   Vitals:   08/25/17 0830  BP: 119/72  Pulse: 98  Weight: 280 lb (127 kg)    Fetal Status:     Movement: Present     General:  Alert, oriented and cooperative. Patient is in no acute distress.  Skin: Skin is warm and dry. No rash noted.   Cardiovascular: Normal heart rate noted  Respiratory: Normal respiratory effort, no problems with respiration noted  Abdomen: Soft, gravid, appropriate for gestational age.  Pain/Pressure: Present     Pelvic: Cervical exam deferred        Extremities: Normal range of motion.  Edema: Trace  Mental Status: Normal mood and affect. Normal behavior. Normal judgment and thought content.   Assessment and Plan:  Pregnancy: G2P1001 at 356w1d1. Supervision of other normal pregnancy, antepartum  2. Rubella non-immune status, antepartum MMR postpartum   3. Nausea/vomiting in pregnancy  4. History of pre-eclampsia in prior pregnancy, currently pregnant  5. History of cesarean section  complicating pregnancy For repeat c-section at 39 weeks  6. Gestational diabetes mellitus (GDM) affecting pregnancy Has growth scan next week NST reviewed and reactive.  Pt was seen in the MAU last night for 'feeling bad at work'. Sx have resoled. Her glc was WNL as was her BP   Preterm labor symptoms and general obstetric precautions including but not limited to vaginal bleeding, contractions, leaking of fluid and fetal movement were reviewed in detail with the patient. Please refer to After Visit Summary for other counseling recommendations.  Return in about 2 weeks (around 09/08/2017).  Future Appointments  Date Time Provider [place]4/22/2019 10:45 AM [place] [facility]-M[facility]US MFC-US  09/01/2017  8:30 AM [facility] NURSE [facility] None    CaLavonia DraftsMD

## 2017-08-29 ENCOUNTER — Encounter (HOSPITAL_COMMUNITY): Payer: Self-pay

## 2017-08-29 ENCOUNTER — Ambulatory Visit (HOSPITAL_COMMUNITY)
Admission: RE | Admit: 2017-08-29 | Discharge: 2017-08-29 | Disposition: A | Discharge status: Home / Self Care | Payer: Medicaid Other | Source: Ambulatory Visit

## 2017-08-29 ENCOUNTER — Other Ambulatory Visit: Payer: Self-pay

## 2017-08-29 ENCOUNTER — Other Ambulatory Visit: Payer: Medicaid Other

## 2017-08-29 DIAGNOSIS — Z3A34 34 weeks gestation of pregnancy: Secondary | ICD-10-CM

## 2017-08-29 DIAGNOSIS — O24415 Gestational diabetes mellitus in pregnancy, controlled by oral hypoglycemic drugs: Secondary | ICD-10-CM | POA: Diagnosis present

## 2017-08-29 DIAGNOSIS — O99213 Obesity complicating pregnancy, third trimester: Secondary | ICD-10-CM | POA: Insufficient documentation

## 2017-08-29 DIAGNOSIS — O24419 Gestational diabetes mellitus in pregnancy, unspecified control: Secondary | ICD-10-CM

## 2017-09-01 ENCOUNTER — Ambulatory Visit (INDEPENDENT_AMBULATORY_CARE_PROVIDER_SITE_OTHER): Payer: Medicaid Other

## 2017-09-01 VITALS — BP 115/78 | HR 76

## 2017-09-01 DIAGNOSIS — Z3689 Encounter for other specified antenatal screening: Secondary | ICD-10-CM

## 2017-09-01 DIAGNOSIS — O24419 Gestational diabetes mellitus in pregnancy, unspecified control: Secondary | ICD-10-CM

## 2017-09-01 NOTE — Progress Notes (Signed)
Pt here for NST. NST reactive per Dr. Derwood KaplanH-S. BP 115/78.

## 2017-09-05 ENCOUNTER — Ambulatory Visit (INDEPENDENT_AMBULATORY_CARE_PROVIDER_SITE_OTHER): Payer: Medicaid Other

## 2017-09-05 VITALS — BP 114/65 | HR 103

## 2017-09-05 DIAGNOSIS — O24419 Gestational diabetes mellitus in pregnancy, unspecified control: Secondary | ICD-10-CM | POA: Diagnosis not present

## 2017-09-08 ENCOUNTER — Other Ambulatory Visit (HOSPITAL_COMMUNITY)
Admission: RE | Admit: 2017-09-08 | Discharge: 2017-09-08 | Disposition: A | Discharge status: Home / Self Care | Payer: Medicaid Other | Source: Ambulatory Visit | Attending: Family Medicine | Admitting: Family Medicine

## 2017-09-08 ENCOUNTER — Ambulatory Visit (INDEPENDENT_AMBULATORY_CARE_PROVIDER_SITE_OTHER): Payer: Medicaid Other | Admitting: Family Medicine

## 2017-09-08 VITALS — BP 120/74 | HR 94 | Wt 282.0 lb

## 2017-09-08 DIAGNOSIS — O0993 Supervision of high risk pregnancy, unspecified, third trimester: Secondary | ICD-10-CM | POA: Insufficient documentation

## 2017-09-08 DIAGNOSIS — O99213 Obesity complicating pregnancy, third trimester: Secondary | ICD-10-CM | POA: Diagnosis not present

## 2017-09-08 DIAGNOSIS — Z3A36 36 weeks gestation of pregnancy: Secondary | ICD-10-CM | POA: Insufficient documentation

## 2017-09-08 DIAGNOSIS — O24419 Gestational diabetes mellitus in pregnancy, unspecified control: Secondary | ICD-10-CM | POA: Diagnosis not present

## 2017-09-08 DIAGNOSIS — O34219 Maternal care for unspecified type scar from previous cesarean delivery: Secondary | ICD-10-CM | POA: Diagnosis not present

## 2017-09-08 DIAGNOSIS — O09293 Supervision of pregnancy with other poor reproductive or obstetric history, third trimester: Secondary | ICD-10-CM

## 2017-09-08 DIAGNOSIS — O9989 Other specified diseases and conditions complicating pregnancy, childbirth and the puerperium: Secondary | ICD-10-CM

## 2017-09-08 DIAGNOSIS — Z283 Underimmunization status: Secondary | ICD-10-CM

## 2017-09-08 DIAGNOSIS — O09899 Supervision of other high risk pregnancies, unspecified trimester: Secondary | ICD-10-CM

## 2017-09-08 DIAGNOSIS — O09299 Supervision of pregnancy with other poor reproductive or obstetric history, unspecified trimester: Secondary | ICD-10-CM

## 2017-09-08 NOTE — Progress Notes (Signed)
Subjective:  Gina Garza is a 32 y.o. G2P1001 at 64w1dbeing seen today for ongoing prenatal care.  She is currently monitored for the following issues for this high-risk pregnancy and has Supervision of other normal pregnancy, antepartum; [place]bid [place] ([place]; History of pre-eclampsia in prior pregnancy, currently pregnant; History of cesarean section complicating pregnancy; Vitamin D deficiency; Rubella non-immune status, antepartum; Gestational diabetes mellitus (GDM) affecting pregnancy; Headache; and Nausea/vomiting in pregnancy on their problem list.  GDM: Patient taking glyburide 2.557mBID.  Reports no hypoglycemic episodes.  Tolerating medication well. Did not bring log today, but reports CBGs as: Fasting: all values <95 2hr PP: all values <120  Patient reports no complaints.  Contractions: Not present. Vag. Bleeding: None.  Movement: Present. Denies leaking of fluid.   The following portions of the patient's history were reviewed and updated as appropriate: allergies, current medications, past family history, past medical history, past social history, past surgical history and problem list. Problem list updated.  Objective:   Vitals:   09/08/17 0811  BP: 120/74  Pulse: 94  Weight: 282 lb (127.9 kg)    Fetal Status:     Movement: Present     General:  Alert, oriented and cooperative. Patient is in no acute distress.  Skin: Skin is warm and dry. No rash noted.   Cardiovascular: Normal heart rate noted  Respiratory: Normal respiratory effort, no problems with respiration noted  Abdomen: Soft, gravid, appropriate for gestational age. Pain/Pressure: Present     Pelvic: Vag. Bleeding: None Vag D/C Character: Thin   Cervical exam deferred        Extremities: Normal range of motion.  Edema: Mild pitting, slight indentation  Mental Status: Normal mood and affect. Normal behavior. Normal judgment and thought content.   Urinalysis:      Assessment and Plan:  Pregnancy: G2P1001 at  3659w1d. Supervision of high risk pregnancy, antepartum, third trimester FHT normal. FH measuring ahead. - Culture, beta strep (group b only) - [facility]lam[facility]. Gestational diabetes mellitus (GDM) affecting pregnancy Last [place]22. EFW 3129g (>90%, AC >97%)  3. History of cesarean section complicating pregnancy Has RLTCS with BTL scheduled 5/24 Continue oral medication  4. History of pre-eclampsia in prior pregnancy, currently pregnant On ASA 22m13mwill discontinue now  5. Rubella non-immune status, antepartum Needs MMR PP.  6. Morbid obesity (HCC)[place]term labor symptoms and general obstetric precautions including but not limited to vaginal bleeding, contractions, leaking of fluid and fetal movement were reviewed in detail with the patient. Please refer to After Visit Summary for other counseling recommendations.  No follow-ups on file.   StinTruett Mainland

## 2017-09-09 LAB — GC/CHLAMYDIA PROBE AMP (~~LOC~~) NOT AT ARMC
Chlamydia: NEGATIVE
NEISSERIA GONORRHEA: NEGATIVE

## 2017-09-12 ENCOUNTER — Ambulatory Visit (INDEPENDENT_AMBULATORY_CARE_PROVIDER_SITE_OTHER): Payer: Medicaid Other

## 2017-09-12 VITALS — BP 121/79 | HR 106 | Wt 282.0 lb

## 2017-09-12 DIAGNOSIS — O24419 Gestational diabetes mellitus in pregnancy, unspecified control: Secondary | ICD-10-CM | POA: Diagnosis not present

## 2017-09-12 LAB — CULTURE, BETA STREP (GROUP B ONLY): STREP GP B CULTURE: POSITIVE — AB

## 2017-09-12 NOTE — Progress Notes (Signed)
NST only visit

## 2017-09-14 ENCOUNTER — Telehealth (HOSPITAL_COMMUNITY): Payer: Self-pay | Admitting: *Deleted

## 2017-09-14 NOTE — Telephone Encounter (Signed)
Preadmission screen  

## 2017-09-15 ENCOUNTER — Ambulatory Visit (INDEPENDENT_AMBULATORY_CARE_PROVIDER_SITE_OTHER): Payer: Medicaid Other | Admitting: Obstetrics & Gynecology

## 2017-09-15 VITALS — BP 120/77 | HR 94 | Wt 278.0 lb

## 2017-09-15 DIAGNOSIS — Z3480 Encounter for supervision of other normal pregnancy, unspecified trimester: Secondary | ICD-10-CM

## 2017-09-15 DIAGNOSIS — O09899 Supervision of other high risk pregnancies, unspecified trimester: Secondary | ICD-10-CM

## 2017-09-15 DIAGNOSIS — Z283 Underimmunization status: Secondary | ICD-10-CM

## 2017-09-15 DIAGNOSIS — O09299 Supervision of pregnancy with other poor reproductive or obstetric history, unspecified trimester: Secondary | ICD-10-CM

## 2017-09-15 DIAGNOSIS — O24419 Gestational diabetes mellitus in pregnancy, unspecified control: Secondary | ICD-10-CM

## 2017-09-15 DIAGNOSIS — O9989 Other specified diseases and conditions complicating pregnancy, childbirth and the puerperium: Secondary | ICD-10-CM

## 2017-09-15 DIAGNOSIS — O219 Vomiting of pregnancy, unspecified: Secondary | ICD-10-CM

## 2017-09-15 NOTE — Progress Notes (Signed)
   PRENATAL VISIT NOTE  Subjective:  Gina Garza is a 32 y.o. G2P1001 at [redacted]w[redacted]d being seen today for ongoing prenatal care.  She is currently monitored for the following issues for this high-risk pregnancy and has Supervision of other normal pregnancy, antepartum; Morbid obesity (HCC); History of pre-eclampsia in prior pregnancy, currently pregnant; History of cesarean section complicating pregnancy; Vitamin D deficiency; Rubella non-immune status, antepartum; Gestational diabetes mellitus (GDM) affecting pregnancy; Headache; and Nausea/vomiting in pregnancy on their problem list.  Patient reports vulvar soreness due to swelling- no discharge or lesion noted. .  Contractions: Not present. Vag. Bleeding: None.  Movement: Present. Denies leaking of fluid.   The following portions of the patient's history were reviewed and updated as appropriate: allergies, current medications, past family history, past medical history, past social history, past surgical history and problem list. Problem list updated.  Objective:   Vitals:   09/15/17 0806  BP: 120/77  Pulse: 94  Weight: 278 lb (126.1 kg)    Fetal Status: Fetal Heart Rate (bpm): NST   Movement: Present     General:  Alert, oriented and cooperative. Patient is in no acute distress.  Skin: Skin is warm and dry. No rash noted.   Cardiovascular: Normal heart rate noted  Respiratory: Normal respiratory effort, no problems with respiration noted  Abdomen: Soft, gravid, appropriate for gestational age.  Pain/Pressure: Present     Pelvic: Cervical exam deferred        Extremities: Normal range of motion.  Edema: Mild pitting, slight indentation  Mental Status: Normal mood and affect. Normal behavior. Normal judgment and thought content.   Assessment and Plan:  Pregnancy: G2P1001 at [redacted]w[redacted]d  1. Gestational diabetes mellitus (GDM) affecting pregnancy  No log  EFW >90th%ile. FH today 51  NST reviewed and reactive.  2. History of pre-eclampsia  in prior pregnancy, currently pregnant  Took ASA. Off as of last visit 3. Supervision of other normal pregnancy, antepartum  4. Rubella non-immune status, antepartum  Need atbx PP 5. Prev cesarean section   For repeat on 09/30/2017  With sterilization   6. Morbid obesity (HCC)   Term labor symptoms and general obstetric precautions including but not limited to vaginal bleeding, contractions, leaking of fluid and fetal movement were reviewed in detail with the patient. Please refer to After Visit Summary for other counseling recommendations.  Return in about 1 week (around 09/22/2017).  Future Appointments  Date Time Provider Department Center  09/19/2017  8:30 AM [facility] NURSE [facility] None  09/22/2017  8:15 AM Levie Heritage, DO [facility] None  09/26/2017  8:30 AM [facility] NURSE [facility] None  09/29/2017  8:15 AM Willodean Rosenthal, MD [facility] None  09/29/2017  9:45 AM [facility]-SD[facility] PAT 5 [facility]-SD[facility] None    Willodean Rosenthal, MD

## 2017-09-19 ENCOUNTER — Ambulatory Visit (INDEPENDENT_AMBULATORY_CARE_PROVIDER_SITE_OTHER): Payer: Medicaid Other

## 2017-09-19 VITALS — BP 126/80 | HR 97

## 2017-09-19 DIAGNOSIS — O24419 Gestational diabetes mellitus in pregnancy, unspecified control: Secondary | ICD-10-CM | POA: Diagnosis not present

## 2017-09-19 NOTE — Progress Notes (Signed)
NST only visit. Armandina Stammer RN  NST reviewed and reactive.  Carolyn L. Harraway-Smith, M.D., [place]

## 2017-09-20 ENCOUNTER — Telehealth (HOSPITAL_COMMUNITY): Payer: Self-pay | Admitting: *Deleted

## 2017-09-20 NOTE — Telephone Encounter (Signed)
Preadmission screen  

## 2017-09-21 ENCOUNTER — Telehealth (HOSPITAL_COMMUNITY): Payer: Self-pay | Admitting: *Deleted

## 2017-09-21 NOTE — Telephone Encounter (Signed)
Preadmission screen  

## 2017-09-22 ENCOUNTER — Ambulatory Visit (INDEPENDENT_AMBULATORY_CARE_PROVIDER_SITE_OTHER): Payer: Medicaid Other | Admitting: Family Medicine

## 2017-09-22 ENCOUNTER — Telehealth (HOSPITAL_COMMUNITY): Payer: Self-pay | Admitting: *Deleted

## 2017-09-22 VITALS — BP 130/80 | HR 82 | Wt 280.0 lb

## 2017-09-22 DIAGNOSIS — O99213 Obesity complicating pregnancy, third trimester: Secondary | ICD-10-CM

## 2017-09-22 DIAGNOSIS — O34219 Maternal care for unspecified type scar from previous cesarean delivery: Secondary | ICD-10-CM

## 2017-09-22 DIAGNOSIS — O09899 Supervision of other high risk pregnancies, unspecified trimester: Secondary | ICD-10-CM

## 2017-09-22 DIAGNOSIS — Z283 Underimmunization status: Secondary | ICD-10-CM

## 2017-09-22 DIAGNOSIS — Z3480 Encounter for supervision of other normal pregnancy, unspecified trimester: Secondary | ICD-10-CM

## 2017-09-22 DIAGNOSIS — O9989 Other specified diseases and conditions complicating pregnancy, childbirth and the puerperium: Secondary | ICD-10-CM

## 2017-09-22 DIAGNOSIS — O24419 Gestational diabetes mellitus in pregnancy, unspecified control: Secondary | ICD-10-CM | POA: Diagnosis not present

## 2017-09-22 MED ORDER — PANTOPRAZOLE SODIUM 40 MG PO TBEC: 40.0000 mg | DELAYED_RELEASE_TABLET | Freq: Every day | ORAL | 1 refills | Status: DC

## 2017-09-22 NOTE — Progress Notes (Signed)
Patient complaining of acid reflux in evening and medication not helping. Patient states she has even been skipping dinner just because she doesn't want to have to deal with the pain. Patient states she forgot her blood sugar log but the only abnormal blood sugar reading she had was Tuesday at lunch and it was 127.  Armandina Stammer RN

## 2017-09-22 NOTE — Progress Notes (Signed)
Subjective:  Gina Garza is a 32 y.o. G2P1001 at [redacted]w[redacted]d being seen today for ongoing prenatal care.  She is currently monitored for the following issues for this high-risk pregnancy and has Supervision of other normal pregnancy, antepartum; Morbid obesity (HCC); History of pre-eclampsia in prior pregnancy, currently pregnant; History of cesarean section complicating pregnancy; Vitamin D deficiency; Rubella non-immune status, antepartum; Gestational diabetes mellitus (GDM) affecting pregnancy; Headache; and Nausea/vomiting in pregnancy on their problem list.  GDM: Patient taking glyburide.  Reports no hypoglycemic episodes. Did not bring log book but reports all but one controlled.  Patient reports no complaints.  Contractions: Not present. Vag. Bleeding: None.  Movement: Present. Denies leaking of fluid.   The following portions of the patient's history were reviewed and updated as appropriate: allergies, current medications, past family history, past medical history, past social history, past surgical history and problem list. Problem list updated.  Objective:   Vitals:   09/22/17 0824  BP: 130/80  Pulse: 82    Fetal Status: Fetal Heart Rate (bpm): NST   Movement: Present     General:  Alert, oriented and cooperative. Patient is in no acute distress.  Skin: Skin is warm and dry. No rash noted.   Cardiovascular: Normal heart rate noted  Respiratory: Normal respiratory effort, no problems with respiration noted  Abdomen: Soft, gravid, appropriate for gestational age. Pain/Pressure: Present     Pelvic: Vag. Bleeding: None     Cervical exam deferred        Extremities: Normal range of motion.  Edema: Mild pitting, slight indentation  Mental Status: Normal mood and affect. Normal behavior. Normal judgment and thought content.   Urinalysis: Urine Protein: Negative Urine Glucose: Negative  Assessment and Plan:  Pregnancy: G2P1001 at [redacted]w[redacted]d  1. Supervision of other normal pregnancy,  antepartum FHT normal  2. Gestational diabetes mellitus (GDM) affecting pregnancy NST reactive  3. Morbid obesity (HCC)  4. Rubella non-immune status, antepartum  5. History of cesarean section complicating pregnancy Scheduled next week  Term labor symptoms and general obstetric precautions including but not limited to vaginal bleeding, contractions, leaking of fluid and fetal movement were reviewed in detail with the patient. Please refer to After Visit Summary for other counseling recommendations.  No follow-ups on file.   Levie Heritage, DO

## 2017-09-22 NOTE — Telephone Encounter (Signed)
Preadmission screen  

## 2017-09-23 ENCOUNTER — Other Ambulatory Visit: Payer: Self-pay

## 2017-09-23 ENCOUNTER — Telehealth (HOSPITAL_COMMUNITY): Payer: Self-pay | Admitting: *Deleted

## 2017-09-23 ENCOUNTER — Inpatient Hospital Stay (HOSPITAL_COMMUNITY)
Admission: AD | Admit: 2017-09-23 | Discharge: 2017-09-23 | Disposition: A | Discharge status: Home / Self Care | Payer: Medicaid Other | Source: Ambulatory Visit | Attending: Obstetrics and Gynecology | Admitting: Obstetrics and Gynecology

## 2017-09-23 ENCOUNTER — Encounter (HOSPITAL_COMMUNITY): Payer: Self-pay

## 2017-09-23 DIAGNOSIS — M7989 Other specified soft tissue disorders: Secondary | ICD-10-CM | POA: Diagnosis present

## 2017-09-23 DIAGNOSIS — O09293 Supervision of pregnancy with other poor reproductive or obstetric history, third trimester: Secondary | ICD-10-CM | POA: Diagnosis not present

## 2017-09-23 DIAGNOSIS — Z3A38 38 weeks gestation of pregnancy: Secondary | ICD-10-CM

## 2017-09-23 DIAGNOSIS — O1203 Gestational edema, third trimester: Secondary | ICD-10-CM | POA: Diagnosis not present

## 2017-09-23 DIAGNOSIS — O24419 Gestational diabetes mellitus in pregnancy, unspecified control: Secondary | ICD-10-CM | POA: Diagnosis not present

## 2017-09-23 DIAGNOSIS — Z7984 Long term (current) use of oral hypoglycemic drugs: Secondary | ICD-10-CM | POA: Diagnosis not present

## 2017-09-23 DIAGNOSIS — Z79899 Other long term (current) drug therapy: Secondary | ICD-10-CM | POA: Diagnosis not present

## 2017-09-23 DIAGNOSIS — O26893 Other specified pregnancy related conditions, third trimester: Secondary | ICD-10-CM | POA: Diagnosis not present

## 2017-09-23 DIAGNOSIS — O99213 Obesity complicating pregnancy, third trimester: Secondary | ICD-10-CM | POA: Diagnosis not present

## 2017-09-23 DIAGNOSIS — Z8249 Family history of ischemic heart disease and other diseases of the circulatory system: Secondary | ICD-10-CM | POA: Insufficient documentation

## 2017-09-23 DIAGNOSIS — Z8759 Personal history of other complications of pregnancy, childbirth and the puerperium: Secondary | ICD-10-CM

## 2017-09-23 DIAGNOSIS — O34219 Maternal care for unspecified type scar from previous cesarean delivery: Secondary | ICD-10-CM | POA: Insufficient documentation

## 2017-09-23 LAB — COMPREHENSIVE METABOLIC PANEL
ALT: 7 U/L — ABNORMAL LOW (ref 14–54)
ANION GAP: 8 (ref 5–15)
AST: 17 U/L (ref 15–41)
Albumin: 3 g/dL — ABNORMAL LOW (ref 3.5–5.0)
Alkaline Phosphatase: 93 U/L (ref 38–126)
BUN: 6 mg/dL (ref 6–20)
CALCIUM: 9.2 mg/dL (ref 8.9–10.3)
CHLORIDE: 106 mmol/L (ref 101–111)
CO2: 22 mmol/L (ref 22–32)
Creatinine, Ser: 0.68 mg/dL (ref 0.44–1.00)
GFR calc non Af Amer: >60 mL/min (ref >60)
Glucose, Bld: 113 mg/dL — ABNORMAL HIGH (ref 65–99)
POTASSIUM: 3.7 mmol/L (ref 3.5–5.1)
SODIUM: 136 mmol/L (ref 135–145)
Total Bilirubin: 0.3 mg/dL (ref 0.3–1.2)
Total Protein: 6.6 g/dL (ref 6.5–8.1)

## 2017-09-23 LAB — CBC
HCT: 31.4 % — ABNORMAL LOW (ref 36.0–46.0)
Hemoglobin: 10.2 g/dL — ABNORMAL LOW (ref 12.0–15.0)
MCH: 25.8 pg — AB (ref 26.0–34.0)
MCHC: 32.5 g/dL (ref 30.0–36.0)
MCV: 79.5 fL (ref 78.0–100.0)
Platelets: 284 10*3/uL (ref 150–400)
RBC: 3.95 MIL/uL (ref 3.87–5.11)
RDW: 14.2 % (ref 11.5–15.5)
WBC: 11.6 10*3/uL — ABNORMAL HIGH (ref 4.0–10.5)

## 2017-09-23 LAB — PROTEIN / CREATININE RATIO, URINE
Creatinine, Urine: 167 mg/dL
PROTEIN CREATININE RATIO: 0.1 mg/mg{creat} (ref 0.00–0.15)
TOTAL PROTEIN, URINE: 17 mg/dL

## 2017-09-23 LAB — URINALYSIS, MICROSCOPIC (REFLEX)

## 2017-09-23 LAB — URINALYSIS, ROUTINE W REFLEX MICROSCOPIC
BILIRUBIN URINE: NEGATIVE
Glucose, UA: NEGATIVE mg/dL
KETONES UR: NEGATIVE mg/dL
NITRITE: NEGATIVE
PH: 7 (ref 5.0–8.0)
Protein, ur: NEGATIVE mg/dL
SPECIFIC GRAVITY, URINE: 1.015 (ref 1.005–1.030)

## 2017-09-23 NOTE — MAU Note (Signed)
Called her dr,her ankles are really swollen and they hurt real bad.  BP was high when she checked it at work

## 2017-09-23 NOTE — MAU Provider Note (Signed)
History     CSN: [id number]  Arrival date and time: 09/23/17 1600   First Provider Initiated Contact with Patient 09/23/17 1707     Chief Complaint  Patient presents with  . Leg Swelling  . Ankle Pain   HPI Gina Garza is a 32 y.o. G2P1001 at [redacted]w[redacted]d who presents with lower extremity swelling and elevated BP. She states she is having a hard time walking and having pain because of the swelling. The swelling is not a new problem. She states she checked her blood pressure at work and it was 150s/90s. She has a hx of preeclampsia in her first pregnancy. She denies any headache, visual changes or epigastric pain.   OB History    Gravida  2   Para  1   Term  1   Preterm      AB      Living  1     SAB      TAB      Ectopic      Multiple      Live Births  1           Past Medical History:  Diagnosis Date  . Gestational diabetes    glyberide x2   . Morbid obesity with body mass index (BMI) of 40.0 or higher (HCC)     Past Surgical History:  Procedure Laterality Date  . CESAREAN SECTION     preE and arrest of dilation   . Cysts removed from chest and under arms      Family History  Problem Relation Age of Onset  . Hypertension Mother   . Stroke Maternal Grandmother   . Cancer Neg Hx     Social History   Tobacco Use  . Smoking status: Never Smoker  . Smokeless tobacco: Never Used  Substance Use Topics  . Alcohol use: No    Frequency: Never  . Drug use: No    Allergies: No Known Allergies  Medications Prior to Admission  Medication Sig Dispense Refill Last Dose  . calcium carbonate (TUMS - DOSED IN MG ELEMENTAL CALCIUM) 500 MG chewable tablet Chew 2 tablets by mouth 2 (two) times daily as needed for indigestion or heartburn.   Past Month at Unknown time  . glyBURIDE (DIABETA) 2.5 MG tablet Take 1 tablet (2.5 mg total) by mouth 2 (two) times daily with a meal. (Patient taking differently: Take 2.5-5 mg by mouth 2 (two) times daily with a meal.  TAKE 1 TABLET (2.5 MG) BY MOUTH IN THE MORNING & 2 TABLETS (5 MG) BY MOUTH AT NIGHT.) 60 tablet 3 09/22/2017 at Unknown time  . pantoprazole (PROTONIX) 40 MG tablet Take 1 tablet (40 mg total) by mouth daily. 30 tablet 1 09/22/2017 at Unknown time  . Prenatal Vit-Fe Fumarate-FA (MULTIVITAMIN-PRENATAL) 27-0.8 MG TABS tablet Take 1 tablet by mouth daily at 12 noon.   09/22/2017 at Unknown time  . ondansetron (ZOFRAN ODT) 4 MG disintegrating tablet Take 1 tablet (4 mg total) by mouth every 8 (eight) hours as needed for nausea or vomiting. (Patient not taking: Reported on 09/22/2017) 20 tablet 0 Not Taking at Unknown time  . Vitamin D, Ergocalciferol, (DRISDOL) 50000 units CAPS capsule Take 1 capsule (50,000 Units total) by mouth every 7 (seven) days. (Patient not taking: Reported on 09/22/2017) 20 capsule 0 Not Taking at Unknown time    Review of Systems  Constitutional: Negative.  Negative for fatigue and fever.  HENT: Negative.   Eyes: Negative for visual  disturbance.  Respiratory: Negative.  Negative for shortness of breath.   Cardiovascular: Negative.  Negative for chest pain.  Gastrointestinal: Negative.  Negative for abdominal pain, constipation, diarrhea, nausea and vomiting.  Genitourinary: Negative.  Negative for dysuria, vaginal bleeding and vaginal discharge.  Musculoskeletal:       Lower leg swelling  Neurological: Negative.  Negative for dizziness and headaches.   Physical Exam   Blood pressure 121/81, pulse 99, temperature 99.3 F (37.4 C), temperature source Oral, resp. rate 18, weight 282 lb 8 oz (128.1 kg), SpO2 98 %.  Patient Vitals for the past 24 hrs:  BP Temp Temp src Pulse Resp SpO2 Weight  09/23/17 1805 - - - - - 99 % -  09/23/17 1800 118/77 - - 99 - 97 % -  09/23/17 1755 - - - - - 97 % -  09/23/17 1700 121/81 - - 95 - - -  09/23/17 1647 - - - - - 98 % -  09/23/17 1643 121/81 - - 99 - - -  09/23/17 1642 - - - - - 100 % -  09/23/17 1621 120/82 99.3 F (37.4 C) Oral 99  18 100 % 282 lb 8 oz (128.1 kg)   Physical Exam  Nursing note and vitals reviewed. Constitutional: She is oriented to person, place, and time. She appears well-developed and well-nourished. No distress.  HENT:  Head: Normocephalic.  Eyes: Pupils are equal, round, and reactive to light.  Cardiovascular: Normal rate, regular rhythm and normal heart sounds.  Respiratory: Effort normal and breath sounds normal. No respiratory distress.  GI: Soft. Bowel sounds are normal. She exhibits no distension. There is no tenderness.  Musculoskeletal: She exhibits edema (bilateral feet and ankles).  Neurological: She is alert and oriented to person, place, and time. She has normal reflexes. She displays normal reflexes. No cranial nerve deficit. Coordination normal.  Skin: Skin is warm and dry.  Psychiatric: She has a normal mood and affect. Her behavior is normal. Judgment and thought content normal.   Fetal Tracing:  Baseline: 125 Variability: moderate Accels: 15x15 Decels: none  Toco: none  MAU Course  Procedures Results for orders placed or performed during the hospital encounter of 09/23/17 (from the past 24 hour(s))  Urinalysis, Routine w reflex microscopic     Status: Abnormal   Collection Time: 09/23/17  4:23 PM  Result Value Ref Range   Color, Urine YELLOW YELLOW   APPearance CLOUDY (A) CLEAR   Specific Gravity, Urine 1.015 1.005 - 1.030   pH 7.0 5.0 - 8.0   Glucose, UA NEGATIVE NEGATIVE mg/dL   Hgb urine dipstick TRACE (A) NEGATIVE   Bilirubin Urine NEGATIVE NEGATIVE   Ketones, ur NEGATIVE NEGATIVE mg/dL   Protein, ur NEGATIVE NEGATIVE mg/dL   Nitrite NEGATIVE NEGATIVE   Leukocytes, UA LARGE (A) NEGATIVE  Protein / creatinine ratio, urine     Status: None   Collection Time: 09/23/17  4:23 PM  Result Value Ref Range   Creatinine, Urine 167.00 mg/dL   Total Protein, Urine 17 mg/dL   Protein Creatinine Ratio 0.10 0.00 - 0.15 mg/mg[Cre]  Urinalysis, Microscopic (reflex)      Status: Abnormal   Collection Time: 09/23/17  4:23 PM  Result Value Ref Range   RBC / HPF 0-5 0 - 5 RBC/hpf   WBC, UA 0-5 0 - 5 WBC/hpf   Bacteria, UA MANY (A) NONE SEEN   Squamous Epithelial / LPF 6-10 0 - 5  CBC     Status: Abnormal  Collection Time: 09/23/17  5:08 PM  Result Value Ref Range   WBC 11.6 (H) 4.0 - 10.5 K/uL   RBC 3.95 3.87 - 5.11 MIL/uL   Hemoglobin 10.2 (L) 12.0 - 15.0 g/dL   HCT 40.9 (L) 81.1 - 91.4 %   MCV 79.5 78.0 - 100.0 fL   MCH 25.8 (L) 26.0 - 34.0 pg   MCHC 32.5 30.0 - 36.0 g/dL   RDW 78.2 95.6 - 21.3 %   Platelets 284 150 - 400 K/uL  Comprehensive metabolic panel     Status: Abnormal   Collection Time: 09/23/17  5:08 PM  Result Value Ref Range   Sodium 136 135 - 145 mmol/L   Potassium 3.7 3.5 - 5.1 mmol/L   Chloride 106 101 - 111 mmol/L   CO2 22 22 - 32 mmol/L   Glucose, Bld 113 (H) 65 - 99 mg/dL   BUN 6 6 - 20 mg/dL   Creatinine, Ser 0.86 0.44 - 1.00 mg/dL   Calcium 9.2 8.9 - 57.8 mg/dL   Total Protein 6.6 6.5 - 8.1 g/dL   Albumin 3.0 (L) 3.5 - 5.0 g/dL   AST 17 15 - 41 U/L   ALT 7 (L) 14 - 54 U/L   Alkaline Phosphatase 93 38 - 126 U/L   Total Bilirubin 0.3 0.3 - 1.2 mg/dL   GFR calc non Af Amer >60 >60 mL/min   GFR calc Af Amer >60 >60 mL/min   Anion gap 8 5 - 15   MDM UA CBC, CMP, Protein/creat ratio  Assessment and Plan   1. Swelling of both lower extremities   2. [redacted] weeks gestation of pregnancy   3. History of pre-eclampsia    -Discharge home in stable condition -Preeclampsia precautions discussed -Patient advised to follow-up with [facility] as scheduled for prenatal care -Patient may return to MAU as needed or if her condition were to change or worsen  Rolm Bookbinder CNM 09/23/2017, 5:07 PM

## 2017-09-23 NOTE — Progress Notes (Addendum)
G2P1 @ 38.[redacted] wksga. Here dt ankle swelling. Ankles slightly swollen with pt being 282 lbs.States swelling worse while at work. 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   Denies LOF or bleeding. +FM.   VSS see flow sheet for details.   1710: provider at bs assessing.   1720: orders for labs noted. Labs drawn.   1818: d/c instructions given with pt understanding. Pt and her child left unit via ambulatory

## 2017-09-23 NOTE — Telephone Encounter (Signed)
Preadmission screen  

## 2017-09-23 NOTE — Discharge Instructions (Signed)
Edema Edema is an abnormal buildup of fluids in your bodytissues. Edema is somewhatdependent on gravity to pull the fluid to the lowest place in your body. That makes the condition more common in the legs and thighs (lower extremities). Painless swelling of the feet and ankles is common and becomes more likely as you get older. It is also common in looser tissues, like around your eyes. When the affected area is squeezed, the fluid may move out of that spot and leave a dent for a few moments. This dent is called pitting. What are the causes? There are many possible causes of edema. Eating too much salt and being on your feet or sitting for a long time can cause edema in your legs and ankles. Hot weather may make edema worse. Common medical causes of edema include:  Heart failure.  Liver disease.  Kidney disease.  Weak blood vessels in your legs.  Cancer.  An injury.  Pregnancy.  Some medications.  Obesity.  What are the signs or symptoms? Edema is usually painless.Your skin may look swollen or shiny. How is this diagnosed? Your health care provider may be able to diagnose edema by asking about your medical history and doing a physical exam. You may need to have tests such as X-rays, an electrocardiogram, or blood tests to check for medical conditions that may cause edema. How is this treated? Edema treatment depends on the cause. If you have heart, liver, or kidney disease, you need the treatment appropriate for these conditions. General treatment may include:  Elevation of the affected body part above the level of your heart.  Compression of the affected body part. Pressure from elastic bandages or support stockings squeezes the tissues and forces fluid back into the blood vessels. This keeps fluid from entering the tissues.  Restriction of fluid and salt intake.  Use of a water pill (diuretic). These medications are appropriate only for some types of edema. They pull fluid  out of your body and make you urinate more often. This gets rid of fluid and reduces swelling, but diuretics can have side effects. Only use diuretics as directed by your health care provider.  Follow these instructions at home:  Keep the affected body part above the level of your heart when you are lying down.  Do not sit still or stand for prolonged periods.  Do not put anything directly under your knees when lying down.  Do not wear constricting clothing or garters on your upper legs.  Exercise your legs to work the fluid back into your blood vessels. This may help the swelling go down.  Wear elastic bandages or support stockings to reduce ankle swelling as directed by your health care provider.  Eat a low-salt diet to reduce fluid if your health care provider recommends it.  Only take medicines as directed by your health care provider. Contact a health care provider if:  Your edema is not responding to treatment.  You have heart, liver, or kidney disease and notice symptoms of edema.  You have edema in your legs that does not improve after elevating them.  You have sudden and unexplained weight gain. Get help right away if:  You develop shortness of breath or chest pain.  You cannot breathe when you lie down.  You develop pain, redness, or warmth in the swollen areas.  You have heart, liver, or kidney disease and suddenly get edema.  You have a fever and your symptoms suddenly get worse. This information is  not intended to replace advice given to you by your health care provider. Make sure you discuss any questions you have with your health care provider. Document Released: 04/26/2005 Document Revised: 10/02/2015 Document Reviewed: 02/16/2013 Elsevier Interactive Patient Education  2017 [employer]   [employer] of the uterus can occur throughout pregnancy, but they are not always a sign that you are in labor. You may have practice  contractions called Braxton Hicks contractions. These false labor contractions are sometimes confused with true labor. What are Deberah Pelton contractions? Braxton Hicks contractions are tightening movements that occur in the muscles of the uterus before labor. Unlike true labor contractions, these contractions do not result in opening (dilation) and thinning of the cervix. Toward the end of pregnancy (32-34 weeks), Braxton Hicks contractions can happen more often and may become stronger. These contractions are sometimes difficult to tell apart from true labor because they can be very uncomfortable. You should not feel embarrassed if you go to the hospital with false labor. Sometimes, the only way to tell if you are in true labor is for your health care provider to look for changes in the cervix. The health care provider will do a physical exam and may monitor your contractions. If you are not in true labor, the exam should show that your cervix is not dilating and your water has not broken. If there are other health problems associated with your pregnancy, it is completely safe for you to be sent home with false labor. You may continue to have Braxton Hicks contractions until you go into true labor. How to tell the difference between true labor and false labor True labor  Contractions last 30-70 seconds.  Contractions become very regular.  Discomfort is usually felt in the top of the uterus, and it spreads to the lower abdomen and low back.  Contractions do not go away with walking.  Contractions usually become more intense and increase in frequency.  The cervix dilates and gets thinner. False labor  Contractions are usually shorter and not as strong as true labor contractions.  Contractions are usually irregular.  Contractions are often felt in the front of the lower abdomen and in the groin.  Contractions may go away when you walk around or change positions while lying  down.  Contractions get weaker and are shorter-lasting as time goes on.  The cervix usually does not dilate or become thin. Follow these instructions at home:  Take over-the-counter and prescription medicines only as told by your health care provider.  Keep up with your usual exercises and follow other instructions from your health care provider.  Eat and drink lightly if you think you are going into labor.  If Braxton Hicks contractions are making you uncomfortable: ? Change your position from lying down or resting to walking, or change from walking to resting. ? Sit and rest in a tub of warm water. ? Drink enough fluid to keep your urine pale yellow. Dehydration may cause these contractions. ? Do slow and deep breathing several times an hour.  Keep all follow-up prenatal visits as told by your health care provider. This is important. Contact a health care provider if:  You have a fever.  You have continuous pain in your abdomen. Get help right away if:  Your contractions become stronger, more regular, and closer together.  You have fluid leaking or gushing from your vagina.  You pass blood-tinged mucus (bloody show).  You have bleeding from your vagina.  You have low back pain that you never had before.  You feel your babys head pushing down and causing pelvic pressure.  Your baby is not moving inside you as much as it used to. Summary  Contractions that occur before labor are called Braxton Hicks contractions, false labor, or practice contractions.  Braxton Hicks contractions are usually shorter, weaker, farther apart, and less regular than true labor contractions. True labor contractions usually become progressively stronger and regular and they become more frequent.  Manage discomfort from [facility] contractions by changing position, resting in a warm bath, drinking plenty of water, or practicing deep breathing. This information is not intended to replace  advice given to you by your health care provider. Make sure you discuss any questions you have with your health care provider. Document Released: 09/09/2016 Document Revised: 09/09/2016 Document Reviewed: 09/09/2016 Elsevier Interactive Patient Education  2018 [employer].

## 2017-09-26 ENCOUNTER — Ambulatory Visit (INDEPENDENT_AMBULATORY_CARE_PROVIDER_SITE_OTHER): Payer: Medicaid Other

## 2017-09-26 VITALS — BP 128/83 | HR 92

## 2017-09-26 DIAGNOSIS — O24419 Gestational diabetes mellitus in pregnancy, unspecified control: Secondary | ICD-10-CM

## 2017-09-26 NOTE — Progress Notes (Signed)
NST only visit. Mirela Parsley RN  NST reviewed and reactive.  Carolyn L. Harraway-Smith, M.D., FACOG    

## 2017-09-28 NOTE — Patient Instructions (Addendum)
Gina Garza  09/28/2017   Your procedure is scheduled on:  09/30/2017  Enter through the Main Entrance of [facility] at 0730 AM.  Pick up the phone at the desk and dial [phone]  Call this number if you have problems the morning of [phone]  Remember:   Do not eat food:(After Midnight) Desps de medianoche.  Do not drink clear liquids: (After Midnight) Desps de medianoche.  Take these medicines the morning of surgery with A SIP OF WATER: DO NOT TAKE GLYBURIDE THE MORNING OF SURGERY.  You may take your protonix   Do not wear jewelry, make-up or nail polish.  Do not wear lotions, powders, or perfumes. Do not wear deodorant.  Do not shave 48 hours prior to surgery.  Do not bring valuables to the hospital.  [facility] is not   responsible for any belongings or valuables brought to the hospital.  Contacts, dentures or bridgework may not be worn into surgery.  Leave suitcase in the car. After surgery it may be brought to your room.  For patients admitted to the hospital, checkout time is 11:00 AM the day of              discharge.    N/A   Please read over the following fact sheets that you were given:   Surgical Site Infection Prevention

## 2017-09-29 ENCOUNTER — Ambulatory Visit (INDEPENDENT_AMBULATORY_CARE_PROVIDER_SITE_OTHER): Payer: Medicaid Other | Admitting: Obstetrics & Gynecology

## 2017-09-29 ENCOUNTER — Encounter (HOSPITAL_COMMUNITY)
Admission: RE | Admit: 2017-09-29 | Discharge: 2017-09-29 | Disposition: A | Discharge status: Home / Self Care | Payer: Medicaid Other | Source: Ambulatory Visit | Attending: Obstetrics & Gynecology | Admitting: Obstetrics & Gynecology

## 2017-09-29 ENCOUNTER — Encounter (HOSPITAL_COMMUNITY): Payer: Self-pay | Admitting: Anesthesiology

## 2017-09-29 ENCOUNTER — Encounter: Payer: Self-pay | Admitting: Obstetrics & Gynecology

## 2017-09-29 VITALS — BP 126/82 | HR 90 | Wt 277.0 lb

## 2017-09-29 DIAGNOSIS — Z283 Underimmunization status: Secondary | ICD-10-CM

## 2017-09-29 DIAGNOSIS — O09293 Supervision of pregnancy with other poor reproductive or obstetric history, third trimester: Secondary | ICD-10-CM

## 2017-09-29 DIAGNOSIS — O9989 Other specified diseases and conditions complicating pregnancy, childbirth and the puerperium: Secondary | ICD-10-CM

## 2017-09-29 DIAGNOSIS — Z3480 Encounter for supervision of other normal pregnancy, unspecified trimester: Secondary | ICD-10-CM

## 2017-09-29 DIAGNOSIS — Z2839 Other underimmunization status: Secondary | ICD-10-CM

## 2017-09-29 DIAGNOSIS — O24419 Gestational diabetes mellitus in pregnancy, unspecified control: Secondary | ICD-10-CM

## 2017-09-29 DIAGNOSIS — O34219 Maternal care for unspecified type scar from previous cesarean delivery: Secondary | ICD-10-CM

## 2017-09-29 DIAGNOSIS — O219 Vomiting of pregnancy, unspecified: Secondary | ICD-10-CM

## 2017-09-29 DIAGNOSIS — O09299 Supervision of pregnancy with other poor reproductive or obstetric history, unspecified trimester: Secondary | ICD-10-CM

## 2017-09-29 LAB — TYPE AND SCREEN
ABO/RH(D): O POS
Antibody Screen: NEGATIVE

## 2017-09-29 LAB — CBC
HCT: 32 % — ABNORMAL LOW (ref 36.0–46.0)
Hemoglobin: 10.3 g/dL — ABNORMAL LOW (ref 12.0–15.0)
MCH: 25.5 pg — ABNORMAL LOW (ref 26.0–34.0)
MCHC: 32.2 g/dL (ref 30.0–36.0)
MCV: 79.2 fL (ref 78.0–100.0)
PLATELETS: 282 10*3/uL (ref 150–400)
RBC: 4.04 MIL/uL (ref 3.87–5.11)
RDW: 14.3 % (ref 11.5–15.5)
WBC: 12.2 10*3/uL — AB (ref 4.0–10.5)

## 2017-09-29 NOTE — Anesthesia Preprocedure Evaluation (Addendum)
Anesthesia Evaluation  Patient identified by MRN, date of birth, ID band Patient awake    Reviewed: Allergy & Precautions, NPO status , Patient's Chart, lab work & pertinent test results  Airway Mallampati: III  TM Distance: >3 FB Neck ROM: Full    Dental  (+) Teeth Intact   Pulmonary neg pulmonary ROS,    Pulmonary exam normal breath sounds clear to auscultation       Cardiovascular negative cardio ROS Normal cardiovascular exam Rhythm:Regular Rate:Normal     Neuro/Psych  Headaches, negative psych ROS   GI/Hepatic Neg liver ROS, GERD  Controlled and Medicated,  Endo/Other  diabetes, Well Controlled, Gestational, Oral Hypoglycemic AgentsMorbid obesity  Renal/GU negative Renal ROS  negative genitourinary   Musculoskeletal   Abdominal (+) + obese,   Peds  Hematology  (+) anemia ,   Anesthesia Other Findings   Reproductive/Obstetrics (+) Pregnancy Previous C/Section Desires sterilization                            Anesthesia Physical Anesthesia Plan  ASA: III  Anesthesia Plan: Spinal   Post-op Pain Management:    Induction:   PONV Risk Score and Plan: 4 or greater and Scopolamine patch - Pre-op, Ondansetron and Treatment may vary due to age or medical condition  Airway Management Planned: Natural Airway  Additional Equipment:   Intra-op Plan:   Post-operative Plan:   Informed Consent: I have reviewed the patients History and Physical, chart, labs and discussed the procedure including the risks, benefits and alternatives for the proposed anesthesia with the patient or authorized representative who has indicated his/her understanding and acceptance.   Dental advisory given  Plan Discussed with: CRNA and Surgeon  Anesthesia Plan Comments:        Anesthesia Quick Evaluation

## 2017-09-29 NOTE — Progress Notes (Signed)
   PRENATAL VISIT NOTE  Subjective:  Gina Garza is a 32 y.o. G2P1001 at [redacted]w[redacted]d being seen today for ongoing prenatal care.  She is currently monitored for the following issues for this high-risk pregnancy and has Supervision of other normal pregnancy, antepartum; Morbid obesity (HCC); History of pre-eclampsia in prior pregnancy, currently pregnant; History of cesarean section complicating pregnancy; Vitamin D deficiency; Rubella non-immune status, antepartum; Gestational diabetes mellitus (GDM) affecting pregnancy; Headache; and Nausea/vomiting in pregnancy on their problem list.  Patient reports nausea.  Contractions: Not present. Vag. Bleeding: None.  Movement: Present. Denies leaking of fluid.   The following portions of the patient's history were reviewed and updated as appropriate: allergies, current medications, past family history, past medical history, past social history, past surgical history and problem list. Problem list updated.  Objective:   Vitals:   09/29/17 0821  BP: 126/82  Pulse: 90  Weight: 277 lb (125.6 kg)    Fetal Status: Fetal Heart Rate (bpm): NST   Movement: Present     General:  Alert, oriented and cooperative. Patient is in no acute distress.  Skin: Skin is warm and dry. No rash noted.   Cardiovascular: Normal heart rate noted  Respiratory: Normal respiratory effort, no problems with respiration noted  Abdomen: Soft, gravid, appropriate for gestational age.  Pain/Pressure: Present     Pelvic: Cervical exam deferred        Extremities: Normal range of motion.  Edema: Mild pitting, slight indentation  Mental Status: Normal mood and affect. Normal behavior. Normal judgment and thought content.   Assessment and Plan:  Pregnancy: G2P1001 at [redacted]w[redacted]d  1. Supervision of other normal pregnancy, antepartum  2. Rubella non-immune status, antepartum Needs vaccine postpartum   3. Nausea/vomiting in pregnancy  4. Morbid obesity (HCC)  5. History of  pre-eclampsia in prior pregnancy, currently pregnant Off baby ASA  6. History of cesarean section complicating pregnancy For repeat c/s in the am Reviewed preop risks etc    7. Gestational diabetes mellitus (GDM) affecting pregnancy All glc levels WNL NST reviewed and reactive.  Term labor symptoms and general obstetric precautions including but not limited to vaginal bleeding, contractions, leaking of fluid and fetal movement were reviewed in detail with the patient. Please refer to After Visit Summary for other counseling recommendations.  Return in about 1 week (around 10/06/2017).  Future Appointments  Date Time Provider Department Center  09/29/2017  9:45 AM [facility] PAT 5 [facility] None    Willodean Rosenthal, MD

## 2017-09-30 ENCOUNTER — Inpatient Hospital Stay (HOSPITAL_COMMUNITY): Payer: Medicaid Other | Admitting: Anesthesiology

## 2017-09-30 ENCOUNTER — Inpatient Hospital Stay (HOSPITAL_COMMUNITY)
Admission: RE | Admit: 2017-09-30 | Discharge: 2017-10-02 | DRG: 784 | Disposition: A | Discharge status: Home / Self Care | Payer: Medicaid Other | Source: Ambulatory Visit | Attending: Obstetrics & Gynecology | Admitting: Obstetrics & Gynecology

## 2017-09-30 ENCOUNTER — Other Ambulatory Visit: Payer: Self-pay

## 2017-09-30 ENCOUNTER — Encounter (HOSPITAL_COMMUNITY)
Admission: RE | Disposition: A | Discharge status: Home / Self Care | Payer: Self-pay | Source: Ambulatory Visit | Attending: Obstetrics & Gynecology

## 2017-09-30 ENCOUNTER — Encounter (HOSPITAL_COMMUNITY): Payer: Self-pay | Admitting: *Deleted

## 2017-09-30 DIAGNOSIS — Z3490 Encounter for supervision of normal pregnancy, unspecified, unspecified trimester: Secondary | ICD-10-CM

## 2017-09-30 DIAGNOSIS — O24429 Gestational diabetes mellitus in childbirth, unspecified control: Secondary | ICD-10-CM | POA: Diagnosis not present

## 2017-09-30 DIAGNOSIS — K219 Gastro-esophageal reflux disease without esophagitis: Secondary | ICD-10-CM | POA: Diagnosis present

## 2017-09-30 DIAGNOSIS — O99824 Streptococcus B carrier state complicating childbirth: Secondary | ICD-10-CM | POA: Diagnosis present

## 2017-09-30 DIAGNOSIS — O9081 Anemia of the puerperium: Secondary | ICD-10-CM | POA: Diagnosis not present

## 2017-09-30 DIAGNOSIS — O9962 Diseases of the digestive system complicating childbirth: Secondary | ICD-10-CM | POA: Diagnosis present

## 2017-09-30 DIAGNOSIS — Z2839 Other underimmunization status: Secondary | ICD-10-CM

## 2017-09-30 DIAGNOSIS — O09299 Supervision of pregnancy with other poor reproductive or obstetric history, unspecified trimester: Secondary | ICD-10-CM

## 2017-09-30 DIAGNOSIS — Z302 Encounter for sterilization: Secondary | ICD-10-CM | POA: Diagnosis not present

## 2017-09-30 DIAGNOSIS — Z9889 Other specified postprocedural states: Secondary | ICD-10-CM

## 2017-09-30 DIAGNOSIS — O9989 Other specified diseases and conditions complicating pregnancy, childbirth and the puerperium: Secondary | ICD-10-CM

## 2017-09-30 DIAGNOSIS — O24425 Gestational diabetes mellitus in childbirth, controlled by oral hypoglycemic drugs: Secondary | ICD-10-CM | POA: Diagnosis present

## 2017-09-30 DIAGNOSIS — O99214 Obesity complicating childbirth: Secondary | ICD-10-CM | POA: Diagnosis present

## 2017-09-30 DIAGNOSIS — O34211 Maternal care for low transverse scar from previous cesarean delivery: Principal | ICD-10-CM | POA: Diagnosis present

## 2017-09-30 DIAGNOSIS — E559 Vitamin D deficiency, unspecified: Secondary | ICD-10-CM | POA: Diagnosis present

## 2017-09-30 DIAGNOSIS — D62 Acute posthemorrhagic anemia: Secondary | ICD-10-CM | POA: Diagnosis not present

## 2017-09-30 DIAGNOSIS — Z283 Underimmunization status: Secondary | ICD-10-CM

## 2017-09-30 DIAGNOSIS — Z98891 History of uterine scar from previous surgery: Secondary | ICD-10-CM

## 2017-09-30 DIAGNOSIS — Z3A39 39 weeks gestation of pregnancy: Secondary | ICD-10-CM

## 2017-09-30 DIAGNOSIS — Z8632 Personal history of gestational diabetes: Secondary | ICD-10-CM | POA: Diagnosis present

## 2017-09-30 DIAGNOSIS — O34219 Maternal care for unspecified type scar from previous cesarean delivery: Secondary | ICD-10-CM | POA: Diagnosis present

## 2017-09-30 DIAGNOSIS — Z3480 Encounter for supervision of other normal pregnancy, unspecified trimester: Secondary | ICD-10-CM

## 2017-09-30 DIAGNOSIS — Z3009 Encounter for other general counseling and advice on contraception: Secondary | ICD-10-CM

## 2017-09-30 LAB — RPR: RPR: NONREACTIVE

## 2017-09-30 LAB — GLUCOSE, CAPILLARY
GLUCOSE-CAPILLARY: 97 mg/dL (ref 65–99)
Glucose-Capillary: 92 mg/dL (ref 65–99)

## 2017-09-30 SURGERY — Surgical Case
Anesthesia: Spinal | Site: Abdomen | Laterality: Bilateral | Wound class: Clean Contaminated

## 2017-09-30 MED ORDER — FENTANYL CITRATE (PF) 100 MCG/2ML IJ SOLN
INTRAMUSCULAR | Status: AC
  Filled 2017-09-30: qty 2

## 2017-09-30 MED ORDER — MORPHINE SULFATE (PF) 0.5 MG/ML IJ SOLN
INTRAMUSCULAR | Status: AC
  Filled 2017-09-30: qty 10

## 2017-09-30 MED ORDER — LACTATED RINGERS IV SOLN: INTRAVENOUS | Status: DC

## 2017-09-30 MED ORDER — MEPERIDINE HCL 25 MG/ML IJ SOLN
INTRAMUSCULAR | Status: AC
  Filled 2017-09-30: qty 1

## 2017-09-30 MED ORDER — PRENATAL MULTIVITAMIN CH
1.0000 | ORAL_TABLET | Freq: Every day | ORAL | Status: DC
  Administered 2017-10-01 – 2017-10-02 (×2): 1 via ORAL
  Filled 2017-09-30 (×2): qty 1

## 2017-09-30 MED ORDER — LACTATED RINGERS IV SOLN
INTRAVENOUS | Status: DC | PRN
  Administered 2017-09-30: 10:00:00 via INTRAVENOUS

## 2017-09-30 MED ORDER — MORPHINE SULFATE (PF) 0.5 MG/ML IJ SOLN
INTRAMUSCULAR | Status: DC | PRN
  Administered 2017-09-30: .2 ug via INTRATHECAL

## 2017-09-30 MED ORDER — KETOROLAC TROMETHAMINE 30 MG/ML IJ SOLN: 30.0000 mg | Freq: Four times a day (QID) | INTRAMUSCULAR | Status: DC | PRN

## 2017-09-30 MED ORDER — SENNOSIDES-DOCUSATE SODIUM 8.6-50 MG PO TABS
2.0000 | ORAL_TABLET | ORAL | Status: DC
  Administered 2017-09-30 – 2017-10-01 (×2): 2 via ORAL
  Filled 2017-09-30 (×2): qty 2

## 2017-09-30 MED ORDER — SIMETHICONE 80 MG PO CHEW
80.0000 mg | CHEWABLE_TABLET | Freq: Three times a day (TID) | ORAL | Status: DC
  Administered 2017-09-30 – 2017-10-02 (×5): 80 mg via ORAL
  Filled 2017-09-30 (×5): qty 1

## 2017-09-30 MED ORDER — ONDANSETRON HCL 4 MG/2ML IJ SOLN: 4.0000 mg | Freq: Three times a day (TID) | INTRAMUSCULAR | Status: DC | PRN

## 2017-09-30 MED ORDER — NALBUPHINE HCL 10 MG/ML IJ SOLN: 5.0000 mg | Freq: Once | INTRAMUSCULAR | Status: DC | PRN

## 2017-09-30 MED ORDER — METOCLOPRAMIDE HCL 5 MG/ML IJ SOLN
INTRAMUSCULAR | Status: AC
  Filled 2017-09-30: qty 2

## 2017-09-30 MED ORDER — SIMETHICONE 80 MG PO CHEW: 80.0000 mg | CHEWABLE_TABLET | ORAL | Status: DC | PRN

## 2017-09-30 MED ORDER — OXYTOCIN 40 UNITS IN LACTATED RINGERS INFUSION - SIMPLE MED: 2.5000 [IU]/h | INTRAVENOUS | Status: DC

## 2017-09-30 MED ORDER — TETANUS-DIPHTH-ACELL PERTUSSIS 5-2.5-18.5 LF-MCG/0.5 IM SUSP
0.5000 mL | Freq: Once | INTRAMUSCULAR | Status: AC
  Administered 2017-10-01: 0.5 mL via INTRAMUSCULAR
  Filled 2017-09-30: qty 0.5

## 2017-09-30 MED ORDER — LACTATED RINGERS IV SOLN
INTRAVENOUS | Status: DC
  Administered 2017-09-30: 17:00:00 via INTRAVENOUS

## 2017-09-30 MED ORDER — MEPERIDINE HCL 25 MG/ML IJ SOLN
INTRAMUSCULAR | Status: DC | PRN
  Administered 2017-09-30 (×2): 12.5 mg via INTRAVENOUS

## 2017-09-30 MED ORDER — PHENYLEPHRINE HCL 10 MG/ML IJ SOLN
INTRAMUSCULAR | Status: DC | PRN
  Administered 2017-09-30 (×2): 80 ug via INTRAVENOUS

## 2017-09-30 MED ORDER — LACTATED RINGERS IV SOLN
INTRAVENOUS | Status: DC
  Administered 2017-09-30 (×3): via INTRAVENOUS

## 2017-09-30 MED ORDER — NALBUPHINE HCL 10 MG/ML IJ SOLN: 5.0000 mg | INTRAMUSCULAR | Status: DC | PRN

## 2017-09-30 MED ORDER — IBUPROFEN 600 MG PO TABS
600.0000 mg | ORAL_TABLET | Freq: Four times a day (QID) | ORAL | Status: DC
  Administered 2017-09-30 – 2017-10-02 (×8): 600 mg via ORAL
  Filled 2017-09-30 (×8): qty 1

## 2017-09-30 MED ORDER — BUPIVACAINE HCL (PF) 0.5 % IJ SOLN
INTRAMUSCULAR | Status: AC
  Filled 2017-09-30: qty 30

## 2017-09-30 MED ORDER — BUPIVACAINE IN DEXTROSE 0.75-8.25 % IT SOLN
INTRATHECAL | Status: DC | PRN
  Administered 2017-09-30: 1.8 mL via INTRATHECAL

## 2017-09-30 MED ORDER — OXYCODONE-ACETAMINOPHEN 5-325 MG PO TABS
2.0000 | ORAL_TABLET | ORAL | Status: DC | PRN
  Administered 2017-10-01 – 2017-10-02 (×2): 2 via ORAL
  Filled 2017-09-30 (×2): qty 2

## 2017-09-30 MED ORDER — METOCLOPRAMIDE HCL 5 MG/ML IJ SOLN
INTRAMUSCULAR | Status: DC | PRN
  Administered 2017-09-30: 10 mg via INTRAVENOUS

## 2017-09-30 MED ORDER — ACETAMINOPHEN 325 MG PO TABS: 650.0000 mg | ORAL_TABLET | ORAL | Status: DC | PRN

## 2017-09-30 MED ORDER — ONDANSETRON HCL 4 MG/2ML IJ SOLN
INTRAMUSCULAR | Status: DC | PRN
  Administered 2017-09-30: 4 mg via INTRAVENOUS

## 2017-09-30 MED ORDER — FENTANYL CITRATE (PF) 100 MCG/2ML IJ SOLN
INTRAMUSCULAR | Status: DC | PRN
  Administered 2017-09-30: 20 ug via INTRATHECAL

## 2017-09-30 MED ORDER — KETOROLAC TROMETHAMINE 30 MG/ML IJ SOLN
INTRAMUSCULAR | Status: AC
  Filled 2017-09-30: qty 1

## 2017-09-30 MED ORDER — ENOXAPARIN SODIUM 40 MG/0.4ML ~~LOC~~ SOLN: 40.0000 mg | SUBCUTANEOUS | Status: DC

## 2017-09-30 MED ORDER — SOD CITRATE-CITRIC ACID 500-334 MG/5ML PO SOLN: 30.0000 mL | Freq: Once | ORAL | Status: DC

## 2017-09-30 MED ORDER — DEXAMETHASONE SODIUM PHOSPHATE 4 MG/ML IJ SOLN
INTRAMUSCULAR | Status: DC | PRN
  Administered 2017-09-30: 4 mg via INTRAVENOUS

## 2017-09-30 MED ORDER — ONDANSETRON HCL 4 MG/2ML IJ SOLN
INTRAMUSCULAR | Status: AC
  Filled 2017-09-30: qty 2

## 2017-09-30 MED ORDER — DEXTROSE 5 % IV SOLN
INTRAVENOUS | Status: AC
  Filled 2017-09-30: qty 3000

## 2017-09-30 MED ORDER — NALOXONE HCL 0.4 MG/ML IJ SOLN: 0.4000 mg | INTRAMUSCULAR | Status: DC | PRN

## 2017-09-30 MED ORDER — ENOXAPARIN SODIUM 80 MG/0.8ML ~~LOC~~ SOLN
65.0000 mg | SUBCUTANEOUS | Status: DC
  Administered 2017-10-01 – 2017-10-02 (×2): 65 mg via SUBCUTANEOUS
  Filled 2017-09-30 (×2): qty 0.8

## 2017-09-30 MED ORDER — PHENYLEPHRINE 8 MG IN D5W 100 ML (0.08MG/ML) PREMIX OPTIME
INJECTION | INTRAVENOUS | Status: DC | PRN
  Administered 2017-09-30: 60 ug/min via INTRAVENOUS

## 2017-09-30 MED ORDER — MENTHOL 3 MG MT LOZG: 1.0000 | LOZENGE | OROMUCOSAL | Status: DC | PRN

## 2017-09-30 MED ORDER — SODIUM CHLORIDE 0.9% FLUSH: 3.0000 mL | INTRAVENOUS | Status: DC | PRN

## 2017-09-30 MED ORDER — OXYTOCIN 10 UNIT/ML IJ SOLN
INTRAMUSCULAR | Status: AC
  Filled 2017-09-30: qty 4

## 2017-09-30 MED ORDER — PHENYLEPHRINE 8 MG IN D5W 100 ML (0.08MG/ML) PREMIX OPTIME
INJECTION | INTRAVENOUS | Status: AC
  Filled 2017-09-30: qty 100

## 2017-09-30 MED ORDER — SOD CITRATE-CITRIC ACID 500-334 MG/5ML PO SOLN
30.0000 mL | ORAL | Status: AC
  Administered 2017-09-30: 30 mL via ORAL
  Filled 2017-09-30: qty 15

## 2017-09-30 MED ORDER — SIMETHICONE 80 MG PO CHEW
80.0000 mg | CHEWABLE_TABLET | ORAL | Status: DC
  Administered 2017-09-30 – 2017-10-01 (×2): 80 mg via ORAL
  Filled 2017-09-30 (×2): qty 1

## 2017-09-30 MED ORDER — OXYTOCIN 10 UNIT/ML IJ SOLN
INTRAVENOUS | Status: DC | PRN
  Administered 2017-09-30: 40 [IU] via INTRAVENOUS

## 2017-09-30 MED ORDER — BUPIVACAINE IN DEXTROSE 0.75-8.25 % IT SOLN
INTRATHECAL | Status: AC
  Filled 2017-09-30: qty 2

## 2017-09-30 MED ORDER — MEASLES, MUMPS & RUBELLA VAC ~~LOC~~ INJ
0.5000 mL | INJECTION | Freq: Once | SUBCUTANEOUS | Status: AC
  Administered 2017-10-02: 0.5 mL via SUBCUTANEOUS
  Filled 2017-09-30 (×2): qty 0.5

## 2017-09-30 MED ORDER — BUPIVACAINE HCL (PF) 0.5 % IJ SOLN
INTRAMUSCULAR | Status: DC | PRN
  Administered 2017-09-30: 30 mL

## 2017-09-30 MED ORDER — SODIUM CHLORIDE 0.9 % IR SOLN
Status: DC | PRN
  Administered 2017-09-30: 1

## 2017-09-30 MED ORDER — OXYCODONE-ACETAMINOPHEN 5-325 MG PO TABS
1.0000 | ORAL_TABLET | ORAL | Status: DC | PRN
  Administered 2017-10-01 – 2017-10-02 (×4): 1 via ORAL
  Filled 2017-09-30 (×4): qty 1

## 2017-09-30 MED ORDER — NALOXONE HCL 4 MG/10ML IJ SOLN: 1.0000 ug/kg/h | INTRAVENOUS | Status: DC | PRN

## 2017-09-30 MED ORDER — PANTOPRAZOLE SODIUM 40 MG PO TBEC
40.0000 mg | DELAYED_RELEASE_TABLET | Freq: Every day | ORAL | Status: DC
  Administered 2017-10-01 – 2017-10-02 (×2): 40 mg via ORAL
  Filled 2017-09-30 (×2): qty 1

## 2017-09-30 MED ORDER — SCOPOLAMINE 1 MG/3DAYS TD PT72
MEDICATED_PATCH | TRANSDERMAL | Status: AC
  Filled 2017-09-30: qty 1

## 2017-09-30 MED ORDER — MEPERIDINE HCL 25 MG/ML IJ SOLN: 6.2500 mg | INTRAMUSCULAR | Status: DC | PRN

## 2017-09-30 MED ORDER — DIPHENHYDRAMINE HCL 25 MG PO CAPS
25.0000 mg | ORAL_CAPSULE | ORAL | Status: DC | PRN
Start: 2017-09-30 — End: 2017-10-02
  Filled 2017-09-30: qty 1

## 2017-09-30 MED ORDER — KETOROLAC TROMETHAMINE 30 MG/ML IJ SOLN
30.0000 mg | Freq: Four times a day (QID) | INTRAMUSCULAR | Status: DC | PRN
  Administered 2017-09-30: 30 mg via INTRAMUSCULAR

## 2017-09-30 MED ORDER — DIPHENHYDRAMINE HCL 50 MG/ML IJ SOLN: 12.5000 mg | INTRAMUSCULAR | Status: DC | PRN

## 2017-09-30 MED ORDER — VITAMIN D (ERGOCALCIFEROL) 1.25 MG (50000 UNIT) PO CAPS
50000.0000 [IU] | ORAL_CAPSULE | ORAL | Status: DC
  Filled 2017-09-30: qty 1

## 2017-09-30 MED ORDER — SCOPOLAMINE 1 MG/3DAYS TD PT72
1.0000 | MEDICATED_PATCH | Freq: Once | TRANSDERMAL | Status: DC
  Administered 2017-09-30: 1.5 mg via TRANSDERMAL

## 2017-09-30 MED ORDER — DEXTROSE 5 % IV SOLN
3.0000 g | INTRAVENOUS | Status: AC
  Administered 2017-09-30: 3 g via INTRAVENOUS
  Filled 2017-09-30: qty 3000

## 2017-09-30 MED ORDER — PHENYLEPHRINE 40 MCG/ML (10ML) SYRINGE FOR IV PUSH (FOR BLOOD PRESSURE SUPPORT)
PREFILLED_SYRINGE | INTRAVENOUS | Status: AC
  Filled 2017-09-30: qty 10

## 2017-09-30 MED ORDER — DEXAMETHASONE SODIUM PHOSPHATE 4 MG/ML IJ SOLN
INTRAMUSCULAR | Status: AC
  Filled 2017-09-30: qty 1

## 2017-09-30 MED ORDER — DIPHENHYDRAMINE HCL 25 MG PO CAPS: 25.0000 mg | ORAL_CAPSULE | Freq: Four times a day (QID) | ORAL | Status: DC | PRN

## 2017-09-30 MED ORDER — FENTANYL CITRATE (PF) 100 MCG/2ML IJ SOLN: 25.0000 ug | INTRAMUSCULAR | Status: DC | PRN

## 2017-09-30 SURGICAL SUPPLY — 47 items
BENZOIN TINCTURE PRP APPL 2/3 (GAUZE/BANDAGES/DRESSINGS) ×3 IMPLANT
BINDER ABD UNIV 10 28-50 (GAUZE/BANDAGES/DRESSINGS) IMPLANT
BINDER ABD UNIV 12 45-62 (WOUND CARE) IMPLANT
BINDER ABDOM UNIV 10 (GAUZE/BANDAGES/DRESSINGS)
BINDER ABDOMINAL 46IN 62IN (WOUND CARE)
CLAMP CORD UMBIL (MISCELLANEOUS) IMPLANT
CLOSURE STERI STRIP 1/2 X4 (GAUZE/BANDAGES/DRESSINGS) ×3 IMPLANT
CLOTH BEACON ORANGE TIMEOUT ST (SAFETY) ×3 IMPLANT
DRSG OPSITE POSTOP 4X10 (GAUZE/BANDAGES/DRESSINGS) ×3 IMPLANT
DURAPREP 26ML APPLICATOR (WOUND CARE) ×3 IMPLANT
ELECT REM PT RETURN 9FT ADLT (ELECTROSURGICAL) ×3
ELECTRODE REM PT RTRN 9FT ADLT (ELECTROSURGICAL) ×1 IMPLANT
EXTRACTOR VACUUM KIWI (MISCELLANEOUS) IMPLANT
GAUZE SPONGE 4X4 12PLY STRL LF (GAUZE/BANDAGES/DRESSINGS) ×6 IMPLANT
GLOVE BIO SURGEON STRL SZ7 (GLOVE) ×3 IMPLANT
GLOVE BIOGEL PI IND STRL 7.0 (GLOVE) ×2 IMPLANT
GLOVE BIOGEL PI INDICATOR 7.0 (GLOVE) ×4
GOWN STRL REUS W/TWL LRG LVL3 (GOWN DISPOSABLE) ×6 IMPLANT
GOWN STRL REUS W/TWL XL LVL3 (GOWN DISPOSABLE) ×3 IMPLANT
HOVERMATT SINGLE USE (MISCELLANEOUS) ×3 IMPLANT
KIT ABG SYR 3ML LUER SLIP (SYRINGE) IMPLANT
KIT PREVENA INCISION MGT20CM45 (CANNISTER) ×3 IMPLANT
NEEDLE HYPO 22GX1.5 SAFETY (NEEDLE) ×3 IMPLANT
NEEDLE HYPO 25X5/8 SAFETYGLIDE (NEEDLE) IMPLANT
NS IRRIG 1000ML POUR BTL (IV SOLUTION) ×3 IMPLANT
PACK C SECTION WH (CUSTOM PROCEDURE TRAY) ×3 IMPLANT
PAD ABD 7.5X8 STRL (GAUZE/BANDAGES/DRESSINGS) ×3 IMPLANT
PAD OB MATERNITY 4.3X12.25 (PERSONAL CARE ITEMS) ×3 IMPLANT
PENCIL SMOKE EVAC W/HOLSTER (ELECTROSURGICAL) ×3 IMPLANT
RETRACTOR TRAXI PANNICULUS (MISCELLANEOUS) ×1 IMPLANT
RTRCTR C-SECT PINK 25CM LRG (MISCELLANEOUS) IMPLANT
SPONGE SURGIFOAM ABS GEL 12-7 (HEMOSTASIS) IMPLANT
SUT PDS AB 0 CTX 60 (SUTURE) IMPLANT
SUT PLAIN 0 NONE (SUTURE) ×3 IMPLANT
SUT PLAIN 2 0 (SUTURE) ×2
SUT PLAIN ABS 2-0 CT1 27XMFL (SUTURE) ×1 IMPLANT
SUT SILK 0 TIES 10X30 (SUTURE) IMPLANT
SUT VIC AB 0 CT1 36 (SUTURE) ×9 IMPLANT
SUT VIC AB 3-0 CT1 27 (SUTURE) ×2
SUT VIC AB 3-0 CT1 TAPERPNT 27 (SUTURE) ×1 IMPLANT
SUT VIC AB 3-0 SH 27 (SUTURE) ×2
SUT VIC AB 3-0 SH 27X BRD (SUTURE) ×1 IMPLANT
SUT VIC AB 4-0 KS 27 (SUTURE) IMPLANT
SYR CONTROL 10ML LL (SYRINGE) ×3 IMPLANT
TOWEL OR 17X24 6PK STRL BLUE (TOWEL DISPOSABLE) ×3 IMPLANT
TRAXI PANNICULUS RETRACTOR (MISCELLANEOUS) ×2
TRAY FOLEY W/BAG SLVR 14FR LF (SET/KITS/TRAYS/PACK) ×3 IMPLANT

## 2017-09-30 NOTE — H&P (Signed)
Obstetric Preoperative History and Physical  Gina Garza is a 32 y.o. G2P1001 with IUP at [redacted]w[redacted]d presenting for presenting for scheduled repeat cesarean section. With bilateral tubal ligation.   No acute concerns.   Prenatal Course Source of Care: [facility]    Pregnancy complications or risks: Patient Active Problem List   Diagnosis Date Noted  . Sterilization consult 09/30/2017  . Headache 08/22/2017  . Nausea/vomiting in pregnancy 08/22/2017  . Gestational diabetes mellitus (GDM) affecting pregnancy 06/08/2017  . Vitamin D deficiency 05/17/2017  . Rubella non-immune status, antepartum 05/17/2017  . Supervision of other normal pregnancy, antepartum 05/04/2017  . Morbid obesity (HCC) 05/04/2017  . History of pre-eclampsia in prior pregnancy, currently pregnant 05/04/2017  . History of cesarean section complicating pregnancy 05/04/2017   She plans to bottle feed She desires bilateral tubal ligation for postpartum contraception.   Prenatal labs and studies: ABO, Rh: --/--/O POS (05/23 1050) Antibody: NEG (05/23 1050) Rubella: <0.90 (12/26 1702) RPR: Non Reactive (05/23 1050)  HBsAg: Negative (12/26 1702)  HIV: Non Reactive (03/05 0905)  GBS: pos 1 hr Glucola  abnormal Genetic screening normal Anatomy US normal  Prenatal Transfer Tool  Maternal Diabetes: Yes:  Diabetes Type:  Insulin/Medication controlled Genetic Screening: Normal Maternal Ultrasounds/Referrals: Normal Fetal Ultrasounds or other Referrals:  None Maternal Substance Abuse:  No Significant Maternal Medications:  Meds include: Other: glyburide Significant Maternal Lab Results: Lab values include: Other: rubella NONIMMUNE  Past Medical History:  Diagnosis Date  . Gestational diabetes    glyberide x2   . Morbid obesity with body mass index (BMI) of 40.0 or higher (HCC)     Past Surgical History:  Procedure Laterality Date  . CESAREAN SECTION     preE and arrest of dilation   . Cysts removed from  chest and under arms      OB History  Gravida Para Term Preterm AB Living  SAB TAB Ectopic Multiple Live Births          1    # Outcome Date GA Lbr Len/2nd Weight Sex Delivery Anes PTL Lv  2 Current           1 Term 11/11/11 [redacted]w[redacted]d  7 lb 11 oz (3.487 kg) F CS-Unspec   LIV     Complications: Failure to Progress in Second Stage, Pre-eclampsia    Social History   Socioeconomic History  . Marital status: Single    Spouse name: Not on file  . Number of children: Not on file  . Years of education: Not on file  . Highest education level: Not on file  Occupational History  . Not on file  Social Needs  . Financial resource strain: Not on file  . Food insecurity:    Worry: Not on file    Inability: Not on file  . Transportation needs:    Medical: Not on file    Non-medical: Not on file  Tobacco Use  . Smoking status: Never Smoker  . Smokeless tobacco: Never Used  Substance and Sexual Activity  . Alcohol use: No    Frequency: Never  . Drug use: No  . Sexual activity: Yes  Lifestyle  . Physical activity:    Days per week: Not on file    Minutes per session: Not on file  . Stress: Not on file  Relationships  . Social connections:    Talks on phone: Not on file    Gets together: Not  on file    Attends religious service: Not on file    Active member of club or organization: Not on file    Attends meetings of clubs or organizations: Not on file    Relationship status: Not on file  Other Topics Concern  . Not on file  Social History Narrative  . Not on file    Family History  Problem Relation Age of Onset  . Hypertension Mother   . Stroke Maternal Grandmother   . Cancer Neg Hx     Medications Prior to Admission  Medication Sig Dispense Refill Last Dose  . calcium carbonate (TUMS - DOSED IN MG ELEMENTAL CALCIUM) 500 MG chewable tablet Chew 2 tablets by mouth 2 (two) times daily as needed for indigestion or heartburn.   Past Month at Unknown time  .  glyBURIDE (DIABETA) 2.5 MG tablet Take 1 tablet (2.5 mg total) by mouth 2 (two) times daily with a meal. (Patient taking differently: Take 2.5-5 mg by mouth 2 (two) times daily with a meal. TAKE 1 TABLET (2.5 MG) BY MOUTH IN THE MORNING & 2 TABLETS (5 MG) BY MOUTH AT NIGHT.) 60 tablet 3 09/22/2017 at Unknown time  . pantoprazole (PROTONIX) 40 MG tablet Take 1 tablet (40 mg total) by mouth daily. 30 tablet 1 09/22/2017 at Unknown time  . Prenatal Vit-Fe Fumarate-FA (MULTIVITAMIN-PRENATAL) 27-0.8 MG TABS tablet Take 1 tablet by mouth daily at 12 noon.   09/22/2017 at Unknown time  . ondansetron (ZOFRAN ODT) 4 MG disintegrating tablet Take 1 tablet (4 mg total) by mouth every 8 (eight) hours as needed for nausea or vomiting. (Patient not taking: Reported on 09/22/2017) 20 tablet 0 Not Taking at Unknown time  . Vitamin D, Ergocalciferol, (DRISDOL) 50000 units CAPS capsule Take 1 capsule (50,000 Units total) by mouth every 7 (seven) days. (Patient not taking: Reported on 09/22/2017) 20 capsule 0 Not Taking at Unknown time    No Known Allergies  Review of Systems: Negative except for what is mentioned in HPI.  Physical Exam: BP 122/82   Pulse 79   Temp 98.8 F (37.1 C) (Oral)   Resp 18   Ht  (1.727 m)   Wt 276 lb 6.4 oz (125.4 kg)   BMI 42.03 kg/m  FHR by Doppler: + bpm GENERAL: Well-developed, well-nourished female in no acute distress.  LUNGS: Clear to auscultation bilaterally.  HEART: Regular rate and rhythm. ABDOMEN: Soft, nontender, nondistended, gravid,  Transverse well-healed Pfannenstiel incision. PELVIC: Deferred EXTREMITIES: Nontender, no edema, 2+ distal pulses.   Pertinent Labs/Studies:   Results for orders placed or performed during the hospital encounter of 09/30/17 (from the past 72 hour(s))  Glucose, capillary     Status: None   Collection Time: 09/30/17  8:06 AM  Result Value Ref Range   Glucose-Capillary 97 65 - 99 mg/dL    Assessment and Plan :Gina Garza is a  32 y.o. G2P1001 at [redacted]w[redacted]d being admitted being admitted for scheduled cesarean section with bilateral tubal ligation. The risks of cesarean section discussed with the patient included but were not limited to: bleeding which may require transfusion or reoperation; infection which may require antibiotics; injury to bowel, bladder, ureters or other surrounding organs; injury to the fetus; need for additional procedures including hysterectomy in the event of a life-threatening hemorrhage; placental abnormalities wth subsequent pregnancies, incisional problems, thromboembolic phenomenon and other postoperative/anesthesia complications. The patient concurred with the proposed plan, giving informed written consent for the procedure. Patient has been NPO  since last night she will remain NPO for procedure. Anesthesia and OR aware. Preoperative prophylactic antibiotics and SCDs ordered on call to the OR. To OR when ready.

## 2017-09-30 NOTE — Op Note (Signed)
PATIENT:  Gina Garza  32 y.o. female  PRE-OPERATIVE DIAGNOSIS:  RCS Undesired Fertility  POST-OPERATIVE DIAGNOSIS:  RCS Undesired Fertility  PROCEDURE:  Procedure(s): REPEAT CESAREAN SECTION WITH BILATERAL TUBAL LIGATION (Bilateral)  SURGEON:  Surgeon(s) and Role:    * Willodean Rosenthal, MD - Primary  ANESTHESIA:   spinal  EBL:  300 mL   BLOOD ADMINISTERED:none  DRAINS: none   LOCAL MEDICATIONS USED:  MARCAINE     SPECIMEN:  Source of Specimen:  placenta  DISPOSITION OF SPECIMEN:  PATHOLOGY  COUNTS:  YES  TOURNIQUET:  * No tourniquets in log *  DICTATION: .Note written in EPIC  PLAN OF CARE: Admit to inpatient   PATIENT DISPOSITION:  PACU - hemodynamically stable.   Delay start of Pharmacological VTE agent (>24hrs) due to surgical blood loss or risk of bleeding: no  Complications: none immediate  INDICATIONS: Gina Garza is a 32 y.o. G2P2002 at [redacted]w[redacted]d here for cesarean section secondary to the indications listed under preoperative diagnosis; please see preoperative note for further details.  The risks of cesarean section were discussed with the patient including but were not limited to: bleeding which may require transfusion or reoperation; infection which may require antibiotics; injury to bowel, bladder, ureters or other surrounding organs; injury to the fetus; need for additional procedures including hysterectomy in the event of a life-threatening hemorrhage; placental abnormalities wth subsequent pregnancies, incisional problems, thromboembolic phenomenon and other postoperative/anesthesia complications; increased risk of ectopic pregnancy if pregnancy occurs and failure rate of 3-08/998.   The patient concurred with the proposed plan, giving informed written consent for the procedure.    FINDINGS:  Viable female infant in cephalic presentation.  Apgars pending.  Clear amniotic fluid.  Intact placenta, three vessel cord.  Normal uterus,  fallopian tubes and ovaries bilaterally.  PROCEDURE IN DETAIL:  The patient preoperatively received intravenous antibiotics and had sequential compression devices applied to her lower extremities.  She was then taken to the operating room where spinal anesthesia was administered and was found to be adequate. She was then placed in a dorsal supine position with a leftward tilt, and prepped and draped in a sterile manner.  A foley catheter was placed into her bladder and attached to constant gravity.  After an adequate timeout was performed, a Pfannenstiel skin incision was made with scalpel and carried through to the underlying layer of fascia. The fascia was incised in the midline, and this incision was extended bilaterally using the Mayo scissors.  Kocher clamps were applied to the superior aspect of the fascial incision and the underlying rectus muscles were dissected off bluntly. A similar process was carried out on the inferior aspect of the fascial incision. The rectus muscles were separated in the midline bluntly and the peritoneum was entered bluntly.  There was quite a bit of scar tissue appreciated in prior to entering the fascia.  A bladder flap was created.  Attention was turned to the lower uterine segment where a low transverse hysterotomy incision was made with a scalpel and extended bilaterally bluntly.  The infant was successfully delivered using a vacuum for extraction, the cord was clamped and cut and the infant was handed over to awaiting neonatology team after a 1 min delay of cord clamping.  The placenta was delivered manually. Uterine massage was then administered.  The placenta was intact with a three-vessel cord. The uterus was then cleared of clot and debris.  The hysterotomy was closed with 0 Vicryl in a running locked  fashion, and an imbricating layer was also placed with the same suture. At this point a kelly clamp was placed over the fimbriated end of the fallopian tube and doubly  clamped. The end of the fallopian tube was removed and the remaining portion was suture ligated with 2 sutures. Excellent hemostasis was noted. The other side was performed in similar fashion leading to a sterilization .  The uterus was returned to the pelvis. The pelvis was cleared of all clot and debris. Hemostasis was confirmed on all surfaces.  The peritoneum and the muscles were reapproximated using 0 Vicryl with 1 interrupted suture. The fascia was then closed using 0 looped PDS.  The subcutaneous layer was irrigated, then reapproximated with 3-0 vicryl in a running locked fashion, and the skin was closed with a 4-0 Vicryl subcuticular stitch.  30 cc of 0.5% marcaine was injected into the incision and benzoin and steristrips were applied. A Prevena prophylactic wound vac was applied. The patient tolerated the procedure well. Sponge, lap, instrument and needle counts were correct x 2.  She was taken to the recovery room in stable condition.   Gina Garza, M.D., [facility]

## 2017-09-30 NOTE — Brief Op Note (Signed)
09/30/2017  10:57 AM  PATIENT:  Gina Garza  32 y.o. female  PRE-OPERATIVE DIAGNOSIS:  RCS Undesired Fertility  POST-OPERATIVE DIAGNOSIS:  RCS Undesired Fertility  PROCEDURE:  Procedure(s): REPEAT CESAREAN SECTION WITH BILATERAL TUBAL LIGATION (Bilateral)  SURGEON:  Surgeon(s) and Role:    * Willodean Rosenthal, MD - Primary  ANESTHESIA:   spinal  EBL:  300 mL   BLOOD ADMINISTERED:none  DRAINS: none   LOCAL MEDICATIONS USED:  MARCAINE     SPECIMEN:  Source of Specimen:  placenta  DISPOSITION OF SPECIMEN:  PATHOLOGY  COUNTS:  YES  TOURNIQUET:  * No tourniquets in log *  DICTATION: .Note written in EPIC  PLAN OF CARE: Admit to inpatient   PATIENT DISPOSITION:  PACU - hemodynamically stable.   Delay start of Pharmacological VTE agent (>24hrs) due to surgical blood loss or risk of bleeding: no  Complications: none immediate  Sekou Zuckerman L. Harraway-Smith, M.D., [facility]

## 2017-09-30 NOTE — Anesthesia Postprocedure Evaluation (Signed)
Anesthesia Post Note  Patient: Gina Garza  Procedure(s) Performed: REPEAT CESAREAN SECTION WITH BILATERAL TUBAL LIGATION (Bilateral Abdomen)     Patient location during evaluation: PACU Anesthesia Type: Spinal Level of consciousness: oriented and awake and alert Pain management: pain level controlled Vital Signs Assessment: post-procedure vital signs reviewed and stable Respiratory status: spontaneous breathing, respiratory function stable and nonlabored ventilation Cardiovascular status: blood pressure returned to baseline and stable Postop Assessment: no headache, no backache, no apparent nausea or vomiting, patient able to bend at knees and spinal receding Anesthetic complications: no    Last Vitals:  Vitals:   09/30/17 1115 09/30/17 1130  BP: 119/65 107/70  Pulse: 85 74  Resp: 17 17  Temp:    SpO2: 100% 100%    Last Pain:  Vitals:   09/30/17 1130  TempSrc:   PainSc: 0-No pain   Pain Goal: Patients Stated Pain Goal: 4 (09/30/17 0803)               Cherl Gorney A.

## 2017-09-30 NOTE — Transfer of Care (Signed)
Immediate Anesthesia Transfer of Care Note  Patient: Gina Garza  Procedure(s) Performed: REPEAT CESAREAN SECTION WITH BILATERAL TUBAL LIGATION (Bilateral Abdomen)  Patient Location: PACU  Anesthesia Type:Spinal  Level of Consciousness: awake, alert  and oriented  Airway & Oxygen Therapy: Patient Spontanous Breathing  Post-op Assessment: Report given to RN and Post -op Vital signs reviewed and stable  Post vital signs: Reviewed and stable  Last Vitals:  Vitals Value Taken Time  BP    Temp    Pulse 73 09/30/2017 11:10 AM  Resp    SpO2 91 % 09/30/2017 11:10 AM  Vitals shown include unvalidated device data.  Last Pain:  Vitals:   09/30/17 0803  TempSrc: Oral  PainSc: 0-No pain      Patients Stated Pain Goal: 4 (09/30/17 0803)  Complications: No apparent anesthesia complications

## 2017-09-30 NOTE — Progress Notes (Signed)
CBG in PACU 92

## 2017-09-30 NOTE — Anesthesia Postprocedure Evaluation (Signed)
Anesthesia Post Note  Patient: Gina Garza  Procedure(s) Performed: REPEAT CESAREAN SECTION WITH BILATERAL TUBAL LIGATION (Bilateral Abdomen)     Patient location during evaluation: Mother Baby Anesthesia Type: Spinal Level of consciousness: awake Pain management: satisfactory to patient Vital Signs Assessment: post-procedure vital signs reviewed and stable Respiratory status: spontaneous breathing Cardiovascular status: stable Anesthetic complications: no    Last Vitals:  Vitals:   09/30/17 1539 09/30/17 1635  BP:    Pulse:    Resp:    Temp:    SpO2: 92% 96%    Last Pain:  Vitals:   09/30/17 1425  TempSrc:   PainSc: 1    Pain Goal: Patients Stated Pain Goal: 4 (09/30/17 0803)               Cephus Shelling

## 2017-09-30 NOTE — Addendum Note (Signed)
Addendum  created 09/30/17 1648 by Algis Greenhouse, CRNA   Sign clinical note

## 2017-09-30 NOTE — Progress Notes (Signed)
Attempted to ambulate, patient is having pain on the left side of abdomen and feels like it is too much right now to ambulate to the bathroom. Pain feels like stretching and a burning sensation. Assisted back to bed and warm pack applied, pain is now gone.

## 2017-09-30 NOTE — Anesthesia Procedure Notes (Signed)
Spinal  Patient location during procedure: OR Start time: 09/30/2017 9:34 AM Staffing Anesthesiologist: Mal Amabile, MD Performed: anesthesiologist  Preanesthetic Checklist Completed: patient identified, site marked, surgical consent, pre-op evaluation, timeout performed, IV checked, risks and benefits discussed and monitors and equipment checked Spinal Block Patient position: sitting Prep: site prepped and draped and DuraPrep Patient monitoring: heart rate, cardiac monitor, continuous pulse ox and blood pressure Approach: midline Location: L4-5 Injection technique: single-shot Needle Needle type: Pencan  Needle gauge: 24 G Needle length: 9 cm Needle insertion depth: 7 cm Assessment Sensory level: T4 Additional Notes Patient tolerated procedure well. Adequate sensory level.

## 2017-10-01 LAB — CBC
HEMATOCRIT: 27.7 % — AB (ref 36.0–46.0)
Hemoglobin: 8.9 g/dL — ABNORMAL LOW (ref 12.0–15.0)
MCH: 25.7 pg — ABNORMAL LOW (ref 26.0–34.0)
MCHC: 32.1 g/dL (ref 30.0–36.0)
MCV: 80.1 fL (ref 78.0–100.0)
Platelets: 261 10*3/uL (ref 150–400)
RBC: 3.46 MIL/uL — ABNORMAL LOW (ref 3.87–5.11)
RDW: 14.3 % (ref 11.5–15.5)
WBC: 16.4 10*3/uL — AB (ref 4.0–10.5)

## 2017-10-01 LAB — BIRTH TISSUE RECOVERY COLLECTION (PLACENTA DONATION)

## 2017-10-01 NOTE — Progress Notes (Signed)
Patient given Tdap and MMR vis.

## 2017-10-01 NOTE — Progress Notes (Addendum)
POSTPARTUM PROGRESS NOTE  POD #1  Subjective:  Gina Garza is a 32 y.o. G2P2002 s/p rLTCS and BTL at [redacted]w[redacted]d.  She reports she doing well. Had 10+ pain yesterday on her left side that was relieved after she used the bathroom.  Now it is at a pain level of 4 when she walks.  She reports she is She denies any problems with ambulating, voiding or po intake. Denies nausea or vomiting. She has passed flatus. Pain is well controlled.  Lochia is nml.  Objective: Blood pressure 128/85, pulse 65, temperature 97.8 F (36.6 C), temperature source Oral, resp. rate 18, height  (1.727 m), weight 276 lb 6.4 oz (125.4 kg), SpO2 99 %, unknown if currently breastfeeding.  Physical Exam:  General: alert, cooperative and no distress Chest: no respiratory distress Heart:regular rate, distal pulses intact Abdomen: soft, nontender,  Uterine Fundus: firm, appropriately tender DVT Evaluation: No calf swelling or tenderness Extremities: no edema Skin: warm, dry; incision clean/dry/intact w/ Prevena prophylactic wound vac  Recent Labs    09/29/17 1050 10/01/17 0533  HGB 10.3* 8.9*  HCT 32.0* 27.7*    Assessment/Plan: Gina Garza is a 32 y.o. [id number] s/p scheduled rLTCS with BTL at [redacted]w[redacted]d.  POD#1 - Doing welll; pain well controlled. H/H appropriate  Routine postpartum care  OOB, ambulated  Lovenox for VTE prophylaxis Acute Blood Loss Anemia:    10.3-->8.9 asymptomatic  Start po ferrous sulfate BID  Contraception: BTL Feeding: Bottlefeeding  Dispo: Plan for discharge likely POD#2/3 depending on clinical improvement.   LOS: 1 day   Alfonso Ellis Medical Student 10/01/2017, 7:09 AM   Midwife attestation Post Partum Day 1 I have seen and examined this patient and agree with above documentation in the medical student's note.   Gina Garza is a 32 y.o. G2P2002 s/p rLTCS and BTL.  Pt denies problems with ambulating, voiding or po intake. Pain is well controlled.  Plan for birth  control is bilateral tubal ligation.  Method of Feeding: bottle  PE:  BP 128/85 (BP Location: Left Arm)   Pulse 65   Temp 97.8 F (36.6 C) (Oral)   Resp 18   Ht  (1.727 m)   Wt 276 lb 6.4 oz (125.4 kg)   SpO2 99%   Breastfeeding? Unknown   BMI 42.03 kg/m  Gen: well appearing Heart: reg rate Lungs: normal WOB Fundus firm Ext: soft, no pain, no edema  Plan for discharge: tomorrow  Sharen Counter, CNM 7:36 AM

## 2017-10-02 LAB — GLUCOSE, CAPILLARY: GLUCOSE-CAPILLARY: 80 mg/dL (ref 65–99)

## 2017-10-02 MED ORDER — IBUPROFEN 600 MG PO TABS: 600.0000 mg | ORAL_TABLET | Freq: Four times a day (QID) | ORAL | 0 refills | Status: DC

## 2017-10-02 MED ORDER — OXYCODONE-ACETAMINOPHEN 5-325 MG PO TABS: 1.0000 | ORAL_TABLET | ORAL | 0 refills | Status: AC | PRN

## 2017-10-02 MED ORDER — SENNOSIDES-DOCUSATE SODIUM 8.6-50 MG PO TABS: 2.0000 | ORAL_TABLET | ORAL | 0 refills | Status: DC

## 2017-10-02 NOTE — Discharge Summary (Addendum)
OB Discharge Summary     Patient Name: Gina Garza DOB: [birth year] MRN: [id number]  Date of admission: 09/30/2017 Delivering MD: Willodean Rosenthal   Date of discharge: 10/02/2017  Admitting diagnosis: RCS Undesired Fertility Intrauterine pregnancy: [redacted]w[redacted]d     Secondary diagnosis:  Principal Problem:   History of cesarean section complicating pregnancy Active Problems:   Supervision of other normal pregnancy, antepartum   Morbid obesity (HCC)   History of pre-eclampsia in prior pregnancy, currently pregnant   Vitamin D deficiency   Rubella non-immune status, antepartum   Gestational diabetes mellitus (GDM) affecting pregnancy   Sterilization consult   Post-operative state  Additional problems: none     Discharge diagnosis: Term Pregnancy Delivered and GDM A2                                                                                                Post partum procedures:postpartum tubal ligation  Augmentation: None  Complications: None  Hospital course:  Scheduled C/S   32 y.o. yo G2P2002 at [redacted]w[redacted]d was admitted to the hospital 09/30/2017 for scheduled cesarean section with the following indication:Elective Repeat.  Membrane Rupture Time/Date: 10:08 AM ,09/30/2017   Patient delivered a Viable infant.09/30/2017  Details of operation can be found in separate operative note.  Pateint had an uncomplicated postpartum course.  She is ambulating, tolerating a regular diet, passing flatus, and urinating well. Patient is discharged home in stable condition on  10/02/17         Physical exam  Vitals:   10/01/17 0605 10/01/17 0926 10/01/17 1800 10/02/17 0559  BP: 128/85 127/86 116/83 103/70  Pulse: 65 70 67 74  Resp: Temp: 97.8 F (36.6 C) 98.5 F (36.9 C) 98.9 F (37.2 C) 98.8 F (37.1 C)  TempSrc: Oral Oral Oral Oral  SpO2:  100%    Weight:      Height:       General: alert, cooperative and no distress Lochia: appropriate Uterine Fundus:  firm Incision: wound vac dressing is clean, dry, and intact DVT Evaluation: No evidence of DVT seen on physical exam. Negative Homan's sign. No cords or calf tenderness. No significant calf/ankle edema. Labs: Lab Results  Component Value Date   WBC 16.4 (H) 10/01/2017   HGB 8.9 (L) 10/01/2017   HCT 27.7 (L) 10/01/2017   MCV 80.1 10/01/2017   PLT 261 10/01/2017   CMP Latest Ref Rng & Units 09/23/2017  Glucose 65 - 99 mg/dL 409(W)  BUN 6 - 20 mg/dL 6  Creatinine 1.19 - 1.47 mg/dL 8.29  Sodium 562 - 130 mmol/L 136  Potassium 3.5 - 5.1 mmol/L 3.7  Chloride 101 - 111 mmol/L 106  CO2 22 - 32 mmol/L 22  Calcium 8.9 - 10.3 mg/dL 9.2  Total Protein 6.5 - 8.1 g/dL 6.6  Total Bilirubin 0.3 - 1.2 mg/dL 0.3  Alkaline Phos 38 - 126 U/L 93  AST 15 - 41 U/L 17  ALT 14 - 54 U/L 7(L)    Discharge instruction: per After Visit Summary and "Baby and Me Booklet".  After visit meds:  Allergies as of 10/02/2017   No Known Allergies     Medication List    STOP taking these medications   glyBURIDE 2.5 MG tablet Commonly known as:  DIABETA   Vitamin D (Ergocalciferol) 50000 units Caps capsule Commonly known as:  DRISDOL     TAKE these medications   ibuprofen 600 MG tablet Commonly known as:  ADVIL,MOTRIN Take 1 tablet (600 mg total) by mouth every 6 (six) hours.   multivitamin-prenatal 27-0.8 MG Tabs tablet Take 1 tablet by mouth daily at 12 noon.   oxyCODONE-acetaminophen 5-325 MG tablet Commonly known as:  PERCOCET/ROXICET Take 1 tablet by mouth every 4 (four) hours as needed for up to 7 days (pain scale 4-7).   pantoprazole 40 MG tablet Commonly known as:  PROTONIX Take 1 tablet (40 mg total) by mouth daily.   senna-docusate 8.6-50 MG tablet Commonly known as:  Senokot-S Take 2 tablets by mouth daily. Start taking on:  10/03/2017       Diet: routine diet  Activity: Advance as tolerated. Pelvic rest for 6 weeks.   Outpatient follow up:2 weeks, needs GTT  postpartum  Follow up Appt: Future Appointments  Date Time Provider Department Center  10/14/2017  8:45 AM Conan Bowens, MD [facility] None   Postpartum contraception: Tubal Ligation  Newborn Data: Live born female  Birth Weight: 8 lb 14.2 oz (4030 g) APGAR: 8, 9  Newborn Delivery   Birth date/time:  09/30/2017 10:09:00 Delivery type:  C-Section, Vacuum Assisted Trial of labor:  No C-section categorization:  Repeat     Baby Feeding: Bottle Disposition:home with mother   10/02/2017 Leland Her, DO PGY-2  OB FELLOW DISCHARGE ATTESTATION  I have seen and examined this patient. I agree with above documentation and have made edits as needed.   Caryl Ada, DO OB Fellow 10:35 AM

## 2017-10-02 NOTE — Discharge Instructions (Signed)

## 2017-10-04 ENCOUNTER — Encounter (HOSPITAL_COMMUNITY): Payer: Self-pay

## 2017-10-06 ENCOUNTER — Telehealth: Payer: Self-pay

## 2017-10-06 NOTE — Telephone Encounter (Signed)
Pt called the office stating that she was told to remove her wound vac when it died. Pt states that she removed it last night and noticed some dried up fluid on her panties and wanted to know if it is ok to cover the incision with a gauze. I advised pt to use a pad  to put on her incision if needed and to continue to keep the incision clean and dry. Understanding was voiced.

## 2017-10-14 ENCOUNTER — Encounter: Payer: Self-pay | Admitting: Obstetrics and Gynecology

## 2017-10-14 ENCOUNTER — Ambulatory Visit: Payer: Medicaid Other | Admitting: Obstetrics and Gynecology

## 2017-10-14 VITALS — BP 153/87 | HR 84 | Wt 261.0 lb

## 2017-10-14 DIAGNOSIS — R030 Elevated blood-pressure reading, without diagnosis of hypertension: Secondary | ICD-10-CM

## 2017-10-14 DIAGNOSIS — T8149XA Infection following a procedure, other surgical site, initial encounter: Secondary | ICD-10-CM

## 2017-10-14 DIAGNOSIS — Z9889 Other specified postprocedural states: Secondary | ICD-10-CM

## 2017-10-14 MED ORDER — CEPHALEXIN 500 MG PO CAPS: 500.0000 mg | ORAL_CAPSULE | Freq: Three times a day (TID) | ORAL | 0 refills | Status: DC

## 2017-10-14 MED ORDER — NIFEDIPINE ER OSMOTIC RELEASE 30 MG PO TB24: 30.0000 mg | ORAL_TABLET | Freq: Every day | ORAL | 2 refills | Status: DC

## 2017-10-14 NOTE — Progress Notes (Signed)
Bottle feeding. Patient reports one side of incision is leaking.  Armandina StammerJennifer Howard RN

## 2017-10-14 NOTE — Progress Notes (Signed)
    Post Cesarean Section Incision Check  Joice LoftsLaquana D Koepp is a 32 y.o. African-American G2P2002 (No LMP recorded. (Menstrual status: Lactating).) who is s/p RCS + BTL on 09/30/17 at 39 weeks for h/o c-section. She was discharged to home on POD#2. Pregnancy complicated by gDMA2 on glyburide.  Complains of odor at incision site, she is concerned that it is open and oozing. Otherwise feeling well, denies headache, nausea/vomiting, blurry vision. Poor appetite, doesn't feel like eating. Voiding and stooling normally. Baby is doing well, she is not breastfeedign.   Physical Exam:  BP (!) 153/87   Pulse 84   Wt 261 lb (118.4 kg)   BMI 39.68 kg/m  Body mass index is 39.68 kg/m. General appearance: Well nourished, well developed female in no acute distress.  Abdomen: soft, appropriately tender, incision clean with some raw areas noted at left aspect, noticeable odor and no pus noted but some serous oozing, mild erythema and tenderness inferior to left aspect of incision, no active bleeding   Assessment: Patient is a 32 y.o. [id number] who is 2 weeks post partum from a repeat c-section. She is doing well but with drainage and odor at incision, appears to be very superficial cellulitis at left aspect of incision. Also with elevated BP, asymptomatic.  1. Post-operative state Doing well  2. Cellulitis, wound, post-operative Keflex sent to pharmacy  3. Elevated BP without diagnosis of hypertension Asymptomatic Started on procardia XL Reviewed s/s to present to MAU    Plan:  Follow up 2 weeks for post partum visit.   Baldemar LenisK. Meryl Davis, M.D. Attending Obstetrician & Gynecologist, [facility] for [facility], [facility]

## 2017-10-27 ENCOUNTER — Ambulatory Visit: Payer: Medicaid Other | Admitting: Obstetrics and Gynecology

## 2017-11-11 ENCOUNTER — Encounter: Payer: Self-pay | Admitting: *Deleted

## 2017-11-11 ENCOUNTER — Other Ambulatory Visit (HOSPITAL_COMMUNITY)
Admission: RE | Admit: 2017-11-11 | Discharge: 2017-11-11 | Disposition: A | Discharge status: Home / Self Care | Payer: Medicaid Other | Source: Ambulatory Visit | Attending: Obstetrics & Gynecology | Admitting: Obstetrics & Gynecology

## 2017-11-11 ENCOUNTER — Ambulatory Visit (INDEPENDENT_AMBULATORY_CARE_PROVIDER_SITE_OTHER): Payer: Medicaid Other | Admitting: Obstetrics & Gynecology

## 2017-11-11 DIAGNOSIS — Z8632 Personal history of gestational diabetes: Secondary | ICD-10-CM

## 2017-11-11 DIAGNOSIS — Z1389 Encounter for screening for other disorder: Secondary | ICD-10-CM

## 2017-11-11 NOTE — Patient Instructions (Signed)
Return to clinic for any scheduled appointments or for any gynecologic concerns as needed.   

## 2017-11-11 NOTE — Progress Notes (Signed)
Post Partum Exam  Gina Garza is a 32 y.o. 732P2002 female who presents for a postpartum visit. She is 6 weeks postpartum following a repeat low cervical transverse cesarean section. I have fully reviewed the prenatal and intrapartum course; complicated by GDM. The delivery was at 39.4 gestational weeks.  Anesthesia: spinal. Postpartum course has been doing well. Baby's course has been doing well. Baby is feeding by bottle - Gerber Gentle. Bleeding no bleeding. Bowel function is normal. Bladder function is normal. Patient is not sexually active. Contraception method is tubal ligation. Postpartum depression screening:negative, score 0.  The following portions of the patient's history were reviewed and updated as appropriate: allergies, current medications, past family history, past medical history, past social history, past surgical history and problem list. Does not know when last pap was done.    Review of Systems Pertinent items noted in HPI and remainder of comprehensive ROS otherwise negative.    Objective:  Blood pressure 129/89, pulse 83, weight 261 lb (118.4 kg), unknown if currently breastfeeding.  General:  alert and no distress   Breasts:  deferred  Lungs: clear to auscultation bilaterally  Heart:  regular rate and rhythm  Abdomen: soft, non-tender; bowel sounds normal; no masses,  no organomegaly and incision healing well, no erythema or drainage, C/D/I   Vulva:  normal  Vagina: normal vagina, no discharge, exudate, lesion, or erythema  Cervix:  Mild bleeding following Pap and no cervical motion tenderness  Corpus: normal  Adnexa:  normal adnexa  Rectal Exam: Not performed.        Assessment:   Normal postpartum exam. Pap smear done at today's visit.   Plan:   1. Contraception: tubal ligation 2. Patient needs postpartum 2 hr GTT; she will return to have this done on 11/14/17 (lab slip printed and given to patient, advised to be fasting). 3. Will follow up pap results and  manage accordingly. 4. Follow up as needed.   Jaynie CollinsUGONNA  ANYANWU, MD, FACOG Obstetrician & Gynecologist, [facility] [employer] [employer], [facility]

## 2017-11-14 ENCOUNTER — Other Ambulatory Visit: Payer: Medicaid Other

## 2017-11-14 ENCOUNTER — Other Ambulatory Visit: Payer: Self-pay | Admitting: Obstetrics & Gynecology

## 2017-11-14 DIAGNOSIS — O99810 Abnormal glucose complicating pregnancy: Secondary | ICD-10-CM

## 2017-11-14 NOTE — Progress Notes (Signed)
Patient presents for 2 hour postpartum lab draw. Patient sent to lab. Armandina StammerJennifer Howard RN

## 2017-11-15 LAB — CYTOLOGY - PAP
Diagnosis: NEGATIVE
HPV: NOT DETECTED

## 2017-11-15 LAB — GLUCOSE TOLERANCE, 2 HOURS
GLUCOSE, 2 HOUR: 102 mg/dL (ref 65–139)
Glucose, GTT - Fasting: 95 mg/dL (ref 65–99)

## 2017-11-16 ENCOUNTER — Encounter: Payer: Self-pay | Admitting: Obstetrics & Gynecology

## 2017-11-16 DIAGNOSIS — Z8632 Personal history of gestational diabetes: Secondary | ICD-10-CM | POA: Insufficient documentation

## 2017-11-24 ENCOUNTER — Other Ambulatory Visit: Payer: Medicaid Other

## 2018-10-10 IMAGING — US US MFM OB FOLLOW-UP
1 series · 14 of 28 positions shown · non-contrast
Comparison: none

[Series 1: us mfm ob follow-up · 98 acquisitions, 14 frames shown]
[im 4/98]
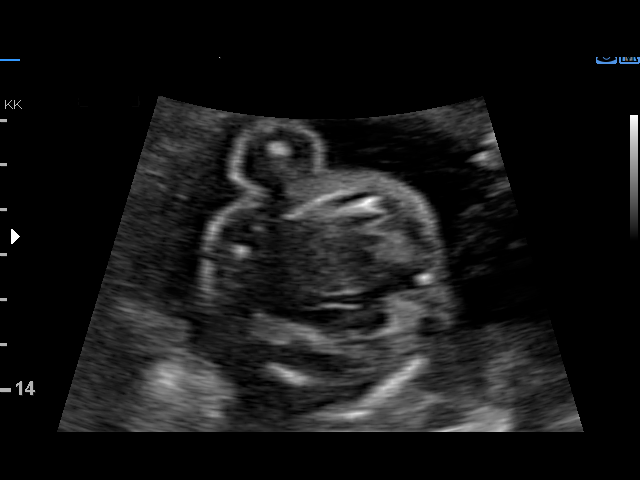
[im 11/98]
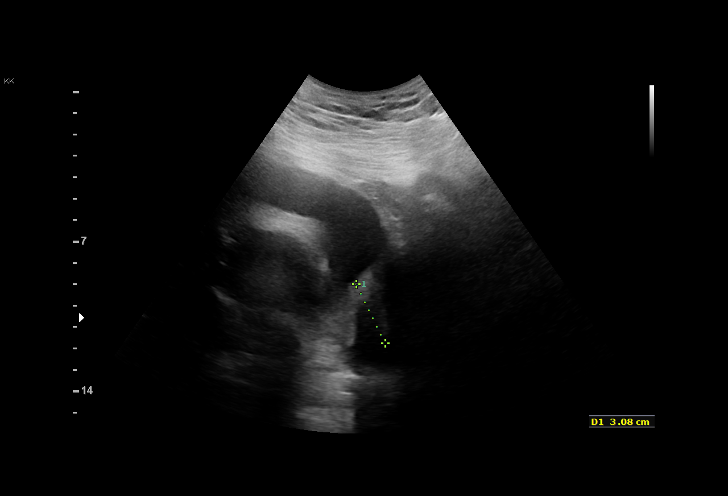
[im 18/98]
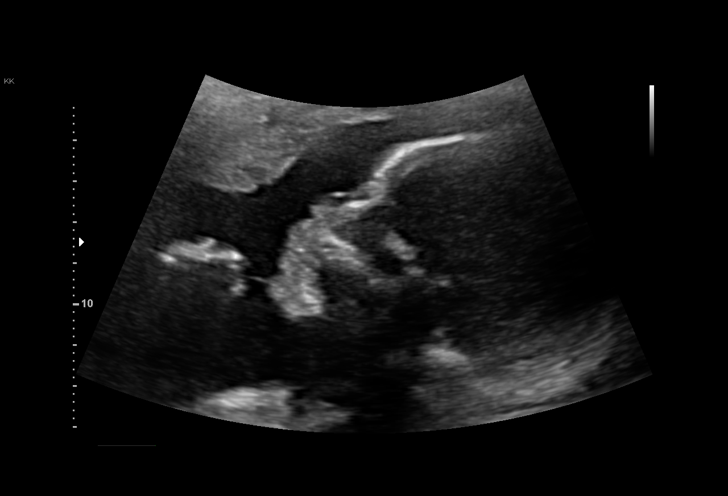
[im 26/98]
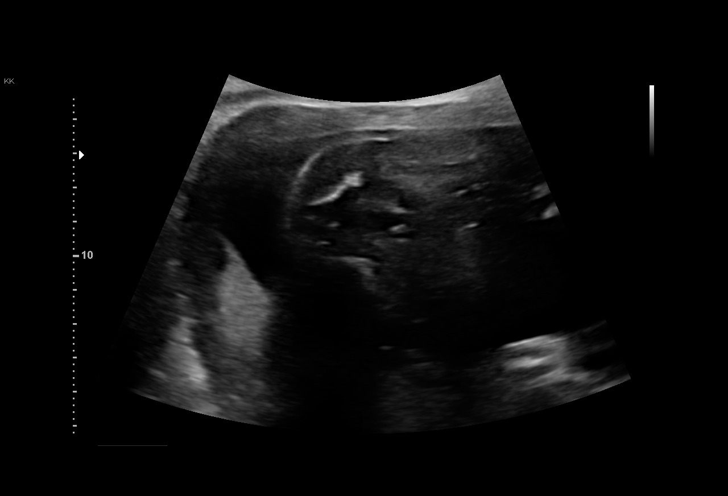
[im 33/98]
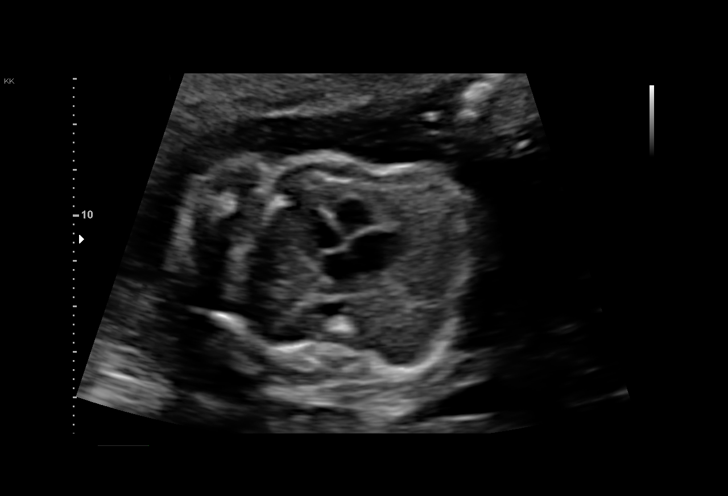
[im 40/98]
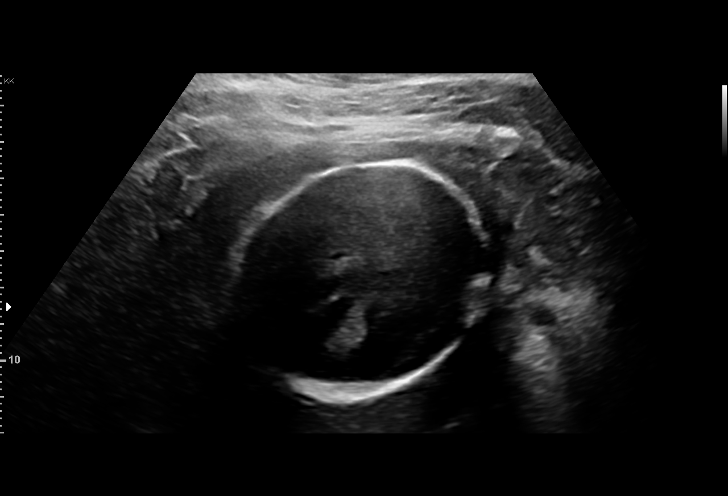
[im 47/98]
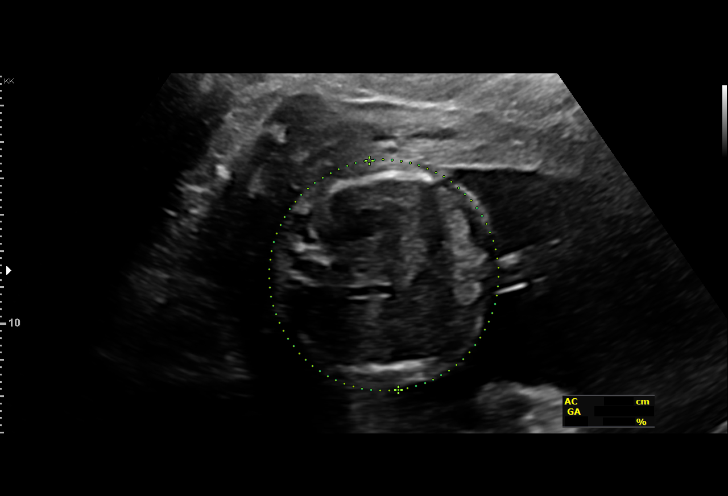
[im 54/98]
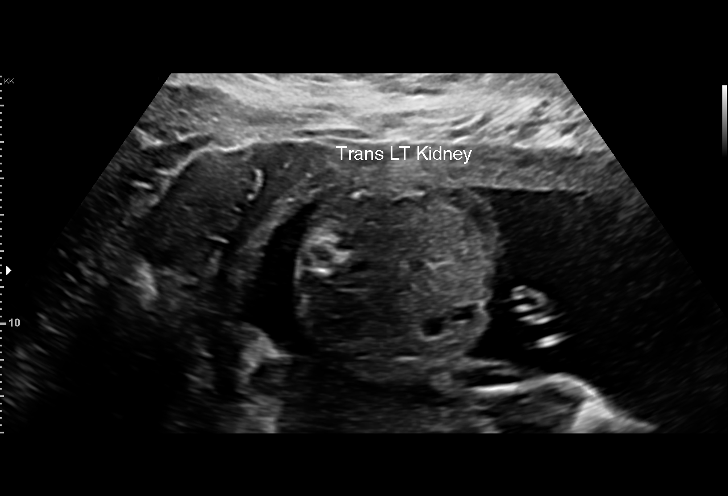
[im 62/98]
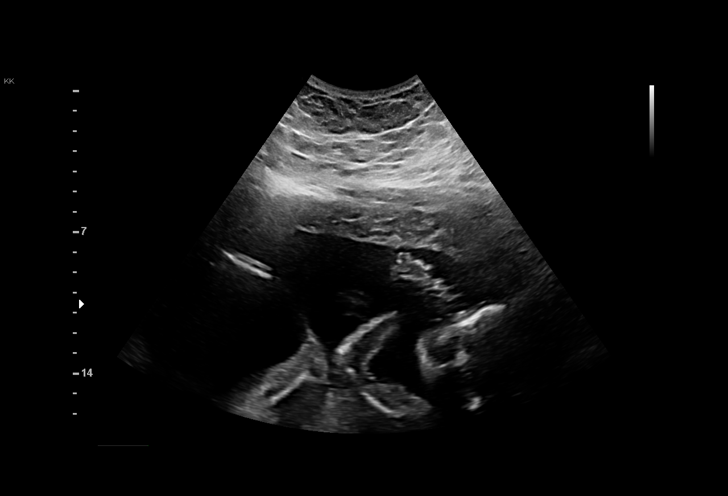
[im 69/98]
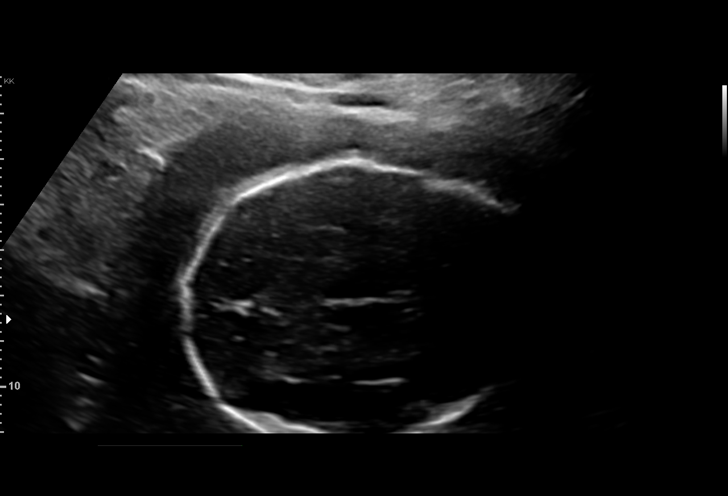
[im 76/98]
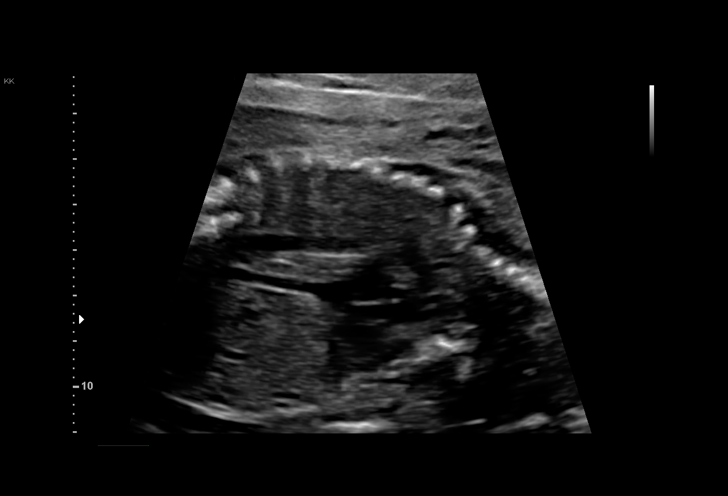
[im 83/98]
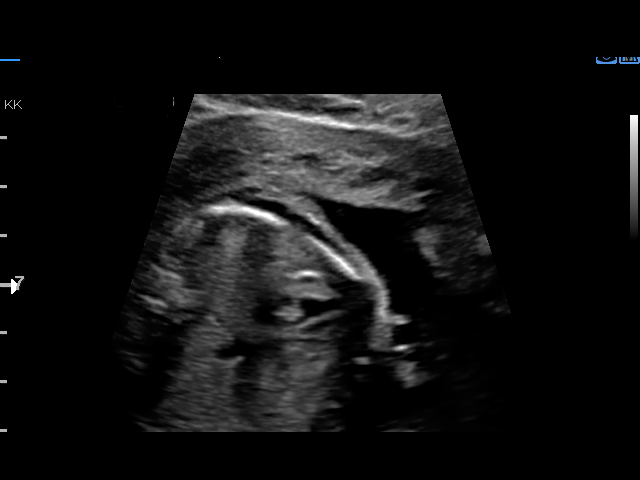
[im 90/98]
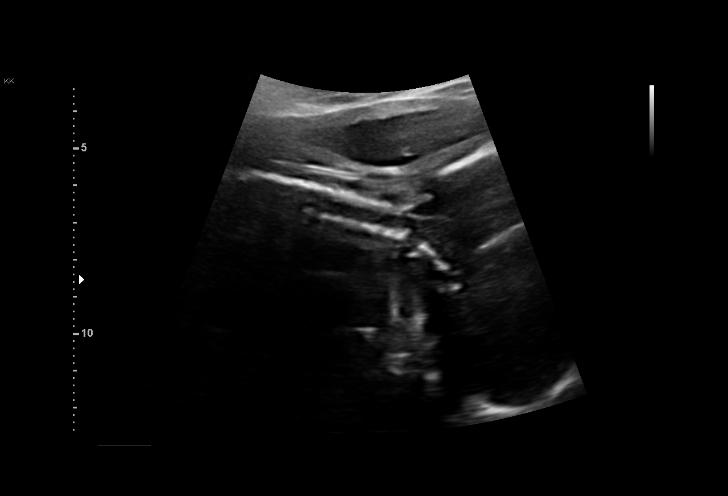
[im 98/98]
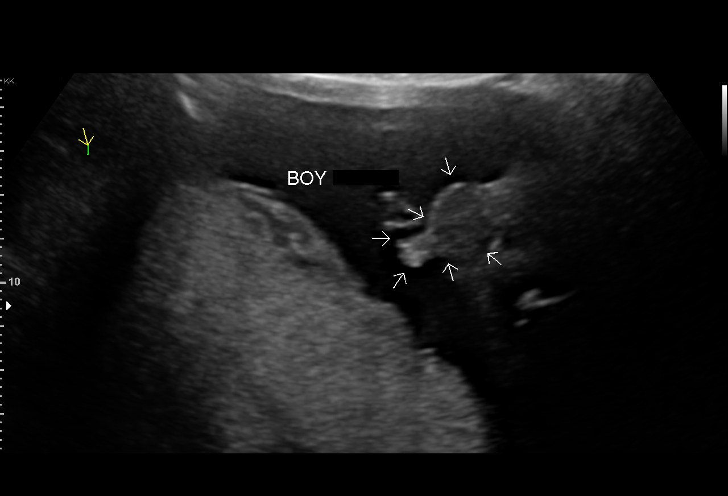

[14 of 28 positions shown; findings below may reference images not displayed]

OB/Gyn Clinic

1  ALIA TIGER            [id number]      [id number]     [id number]
Indications

23 weeks gestation of pregnancy
Obesity complicating pregnancy, second
trimester (OGZ-7U.O)
Previous cesarean delivery, antepartum
Encounter for other antenatal screening
follow-up
OB History

Blood Type:            Height:  5'7"   Weight (lb):  279       BMI:
Gravidity:    2         Term:   1
Living:       1
Fetal Evaluation

Num Of Fetuses:     1
Fetal Heart         149
Rate(bpm):
Cardiac Activity:   Observed
Presentation:       Cephalic
Placenta:           Posterior, above cervical os
P. Cord Insertion:  Previously Visualized
Amniotic Fluid
AFI FV:      Subjectively within normal limits

Largest Pocket(cm)
6.1
Biometry

BPD:        63  mm     G. Age:  25w 4d         94  %    CI:         83.12  %    70 - 86
FL/HC:       20.9  %    18.7 -
HC:      217.9  mm     G. Age:  23w 6d         38  %    HC/AC:       1.10       1.05 -
AC:        198  mm     G. Age:  24w 4d         65  %    FL/BPD:      72.4  %    71 - 87
FL:       45.6  mm     G. Age:  25w 1d         80  %    FL/AC:       23.0  %    20 - 24

Est. FW:     724   gm    1 lb 10 oz     70  %
Gestational Age

U/S Today:     24w 6d                                        EDD:    09/27/17
Best:          23w 5d     Det. By:  Early Ultrasound         EDD:    10/05/17
(04/05/17)
Anatomy

Cranium:               Appears normal         Aortic Arch:            Previously seen
Cavum:                 Appears normal         Ductal Arch:            Not well visualized
Ventricles:            Appears normal         Diaphragm:              Appears normal
Choroid Plexus:        Appears normal         Stomach:                Appears normal, left
sided
Cerebellum:            Appears normal         Abdomen:                Appears normal
Posterior Fossa:       Appears normal         Abdominal Wall:         Appears nml (cord
insert, abd wall)
Nuchal Fold:           Previously seen        Cord Vessels:           Previously seen
Face:                  Appears normal         Kidneys:                Appear normal
(orbits and profile)
Lips:                  Appears normal         Bladder:                Appears normal
Thoracic:              Appears normal         Spine:                  Previously seen
Heart:                 Appears normal         Upper Extremities:      Previously seen
(4CH, axis, and situs
RVOT:                  Appears normal         Lower Extremities:      Previously seen
LVOT:                  Appears normal

Other:  Fetus appears to be a male. Heels visualized. Technically difficult due
to maternal habitus and fetal position.
Cervix Uterus Adnexa

Cervix
Length:            3.1  cm.
Normal appearance by transabdominal scan.
Impression

SIUP at 23+5 weeks
Normal interval anatomy; anatomic survey complete except
for the DA
Normal amniotic fluid volume
Appropriate interval growth with EFW at the 70th %tile
Recommendations
Follow-up as clinically indicated

## 2018-11-21 IMAGING — US US MFM OB FOLLOW-UP
1 series · 14 of 28 positions shown · non-contrast
Comparison: none

OB/Gyn Clinic

1  NARIA ONOFRE            [id number]      [id number]     [id number]
Indications
29 weeks gestation of pregnancy
Obesity complicating pregnancy, second
trimester (AJZ-ET.O)
Previous cesarean delivery, antepartum
Encounter for other antenatal screening
follow-up
Gestational diabetes in pregnancy,
controlled by oral hypoglycemic drugs
OB History
Blood Type:            Height:  5'7"   Weight (lb):  279       BMI:
Gravidity:    2         Term:   1
Living:       1
Fetal Evaluation
Num Of Fetuses:     1
Fetal Heart         137
Rate(bpm):
Cardiac Activity:   Observed
Presentation:       Cephalic
Placenta:           Posterior, above cervical os
P. Cord Insertion:  Previously Visualized
Amniotic Fluid
AFI FV:      Subjectively within normal limits
AFI Sum(cm)     %Tile       Largest Pocket(cm)
18.88           72
RUQ(cm)       RLQ(cm)       LUQ(cm)        LLQ(cm)
5.91
Biometry
BPD:      80.8  mm     G. Age:  32w 3d         97  %    CI:        75.58   %    70 - 86
FL/HC:      20.4   %    19.2 -
HC:      294.7  mm     G. Age:  32w 4d         90  %    HC/AC:      1.07        0.99 -
AC:      274.3  mm     G. Age:  31w 4d         90  %    FL/BPD:     74.4   %    71 - 87
FL:       60.1  mm     G. Age:  31w 2d         78  %    FL/AC:      21.9   %    20 - 24
Est. FW:    7260  gm           4 lb     81  %
Gestational Age
U/S Today:     32w 0d                                        EDD:   09/19/17
Best:          29w 5d     Det. By:  Early Ultrasound         EDD:   10/05/17
(04/05/17)
Anatomy
Cranium:               Appears normal         Aortic Arch:            Previously seen
Cavum:                 Previously seen        Ductal Arch:            Not well visualized
Ventricles:            Previously seen        Diaphragm:              Previously seen
Choroid Plexus:        Previously seen        Stomach:                Appears normal, left
sided
Cerebellum:            Previously seen        Abdomen:                Appears normal
Posterior Fossa:       Previously seen        Abdominal Wall:         Previously seen
Nuchal Fold:           Previously seen        Cord Vessels:           Previously seen
Face:                  Orbits and profile     Kidneys:                Appear normal
previously seen
Lips:                  Previously seen        Bladder:                Appears normal
Thoracic:              Appears normal         Spine:                  Previously seen
Heart:                 Previously seen        Upper Extremities:      Previously seen
RVOT:                  Previously seen        Lower Extremities:      Previously seen
LVOT:                  Previously seen
Other:  Male gender. Heels previously visualized. Technically difficult due to
maternal habitus and fetal position.
Cervix Uterus Adnexa
Cervix
Not visualized (advanced GA >74wks)
Impression
INDICATION: 31 yr old UTBLQQL at 71w4d with gestational
diabetes A2 for fetal growth.

[Series 1: us mfm ob follow-up · 33 acquisitions, 14 frames shown]
[im 2/33]
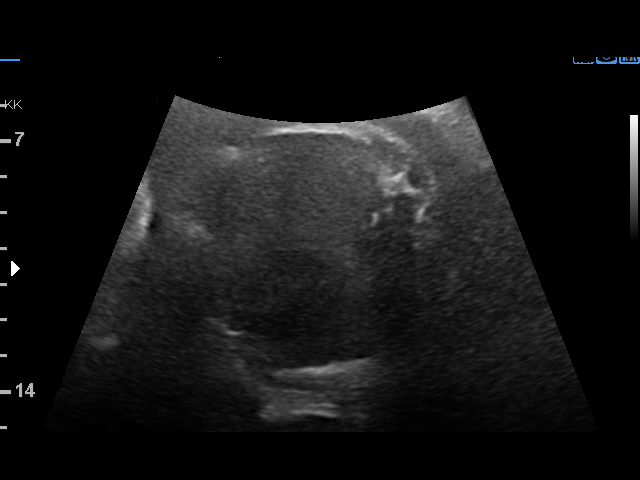
[im 4/33]
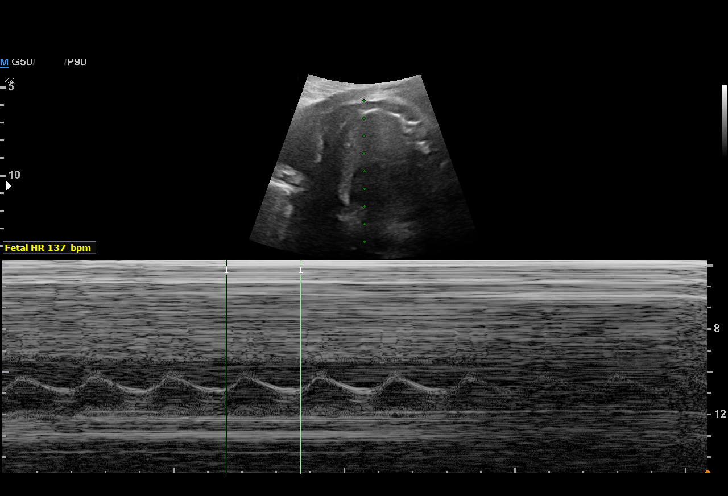
[im 6/33]
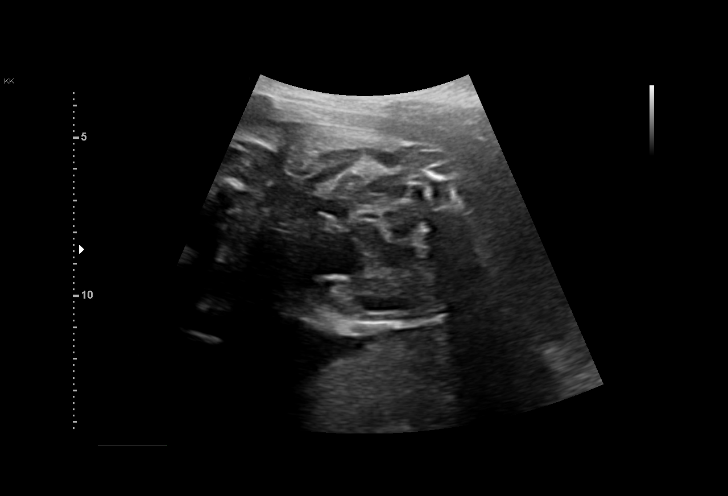
[im 9/33]
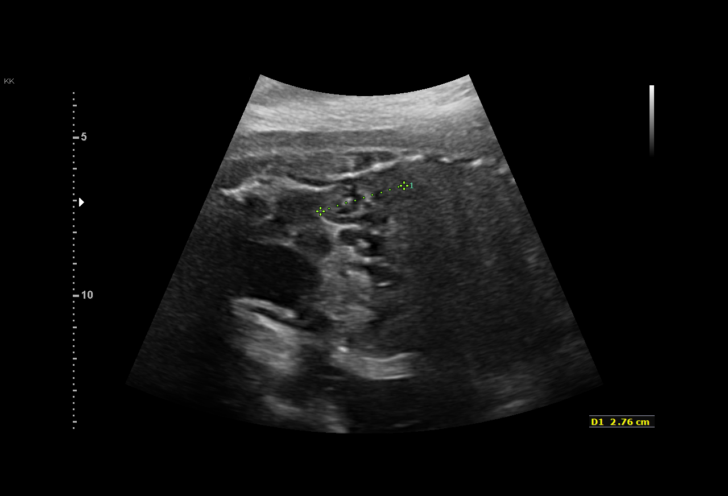
[im 11/33]
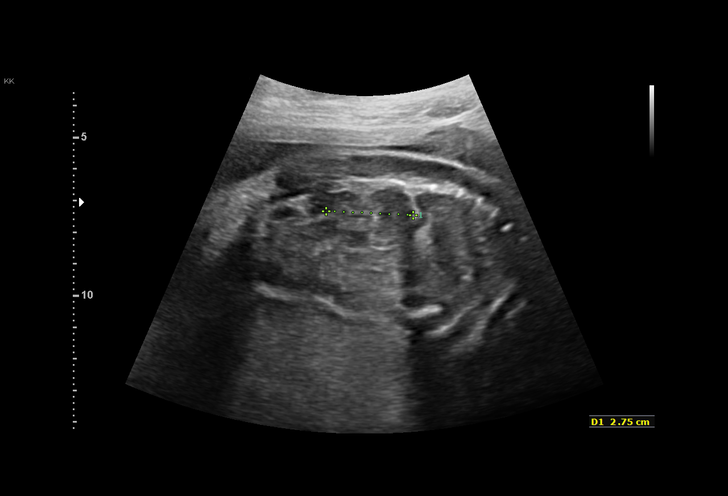
[im 14/33]
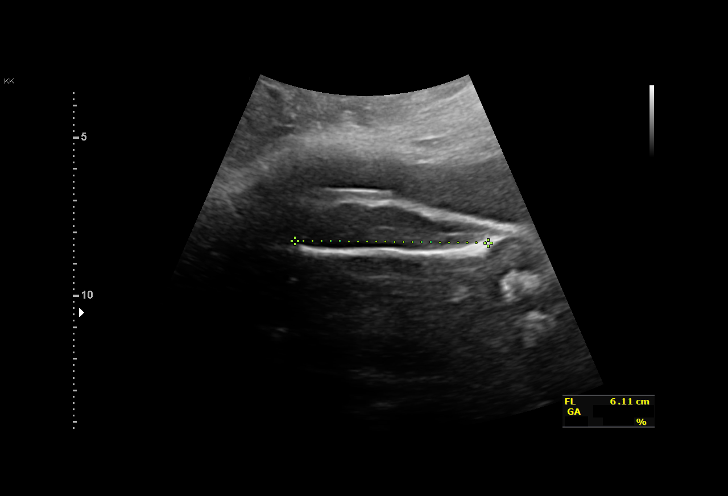
[im 16/33]
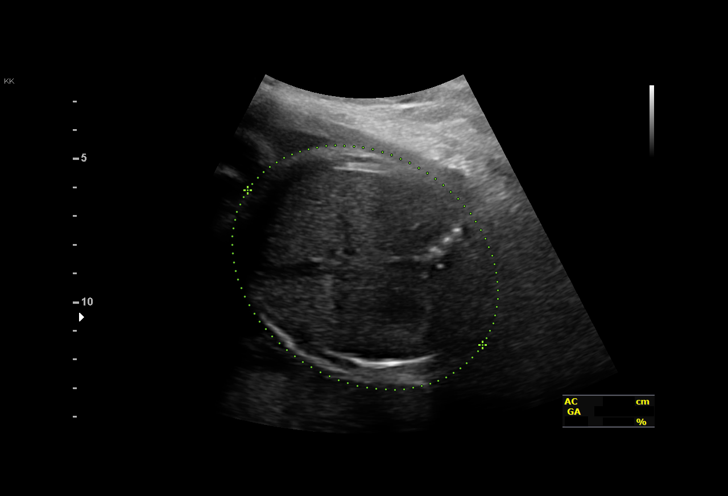
[im 18/33]
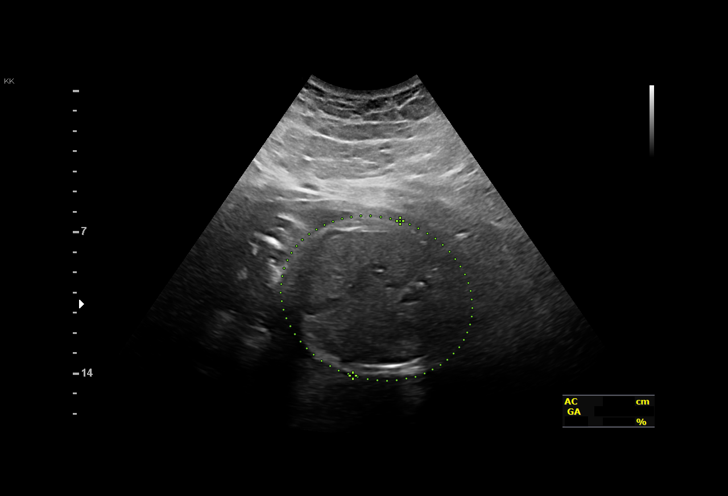
[im 21/33]
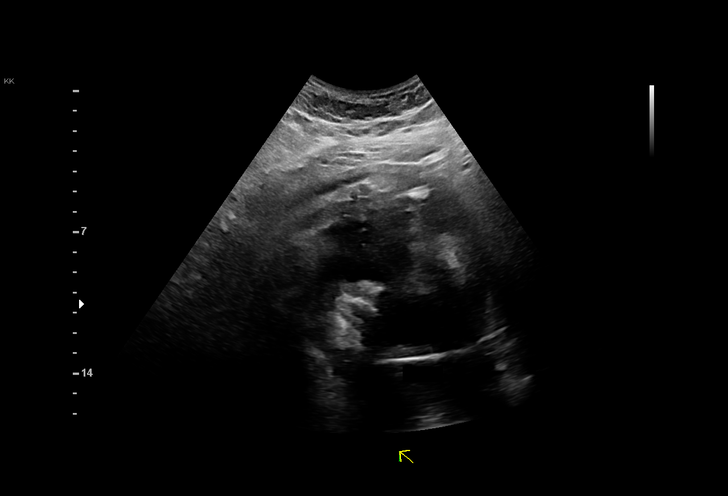
[im 23/33]
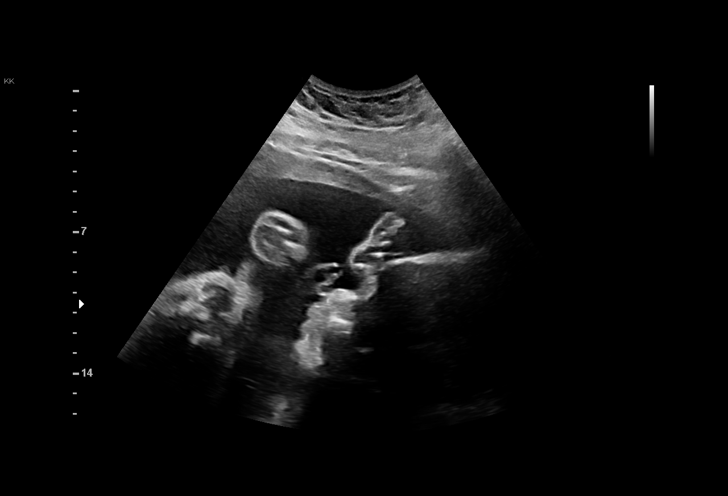
[im 25/33]
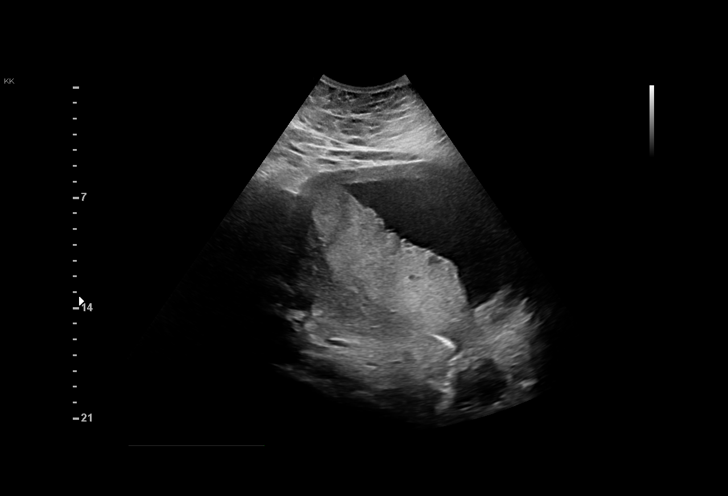
[im 28/33]
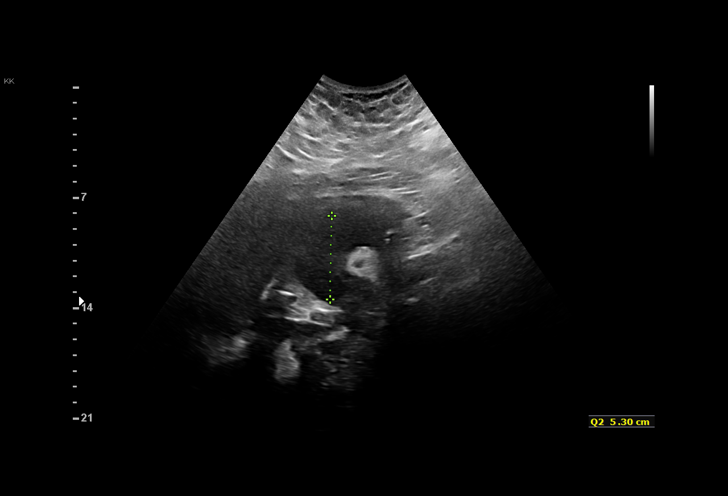
[im 30/33]
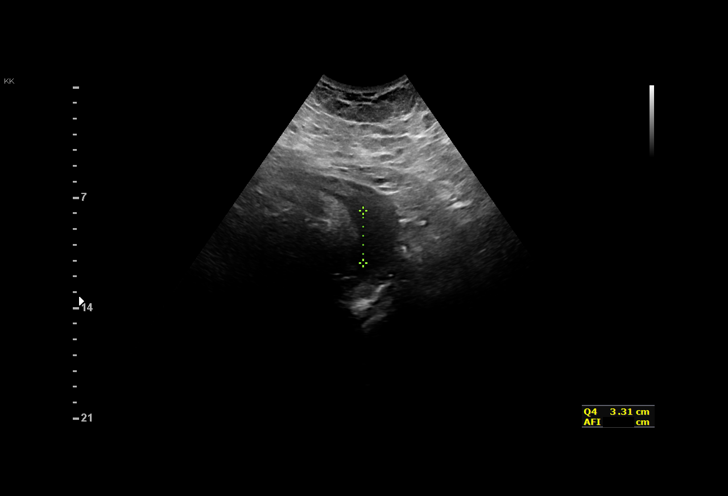
[im 33/33]
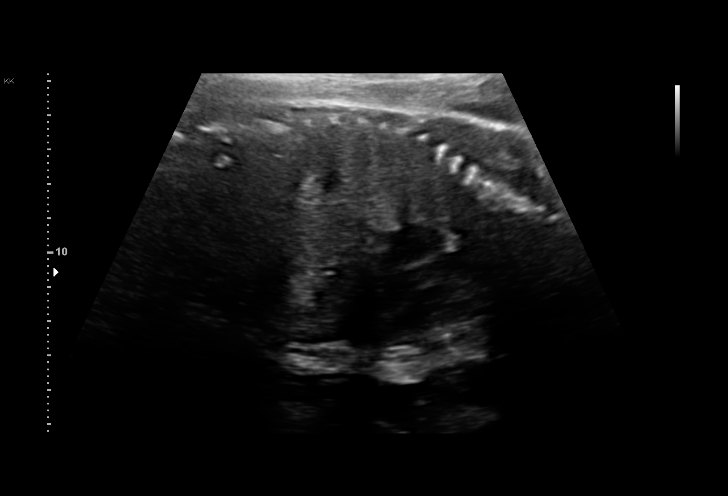

[14 of 28 positions shown; findings below may reference images not displayed]

FINDINGS: 1. Single intrauterine pregnancy with normal cardiac activity.
2. Estimated fetal weight is in the 81st%.
3. Posterior placenta without evidence of previa.
4. Normal amniotic fluid index.
5. The views of the ductal arch remain limited.
6. The remainder of the limited anatomy survey is normal.
Recommendations

1. Appropriate fetal growth.
2. Views of the ductal arch remain limited:
- reevaluate on follow up ultrasound
3. Gestational diabetes:
- on metformin
- recommend fetal growth every 4 weeks
- recommend start antenatal testing at 32 weeks
4. Normal quad screen

## 2019-03-05 ENCOUNTER — Telehealth: Payer: Self-pay

## 2019-03-05 NOTE — Telephone Encounter (Signed)
Pt called the office stating she has been experiencing painful urination, urinary urgency, and urinary frequency x 3 days. Pt states that she has taken AZO and it provided some relief. Advised pt to continue to drink water. Pt is scheduled to come to be seen on 03/06/19.  Alann Avey l Jessi Pitstick, CMA

## 2019-03-06 ENCOUNTER — Other Ambulatory Visit: Payer: Self-pay

## 2019-03-06 ENCOUNTER — Ambulatory Visit (INDEPENDENT_AMBULATORY_CARE_PROVIDER_SITE_OTHER): Payer: Self-pay

## 2019-03-06 VITALS — BP 131/98 | HR 85 | Ht 67.5 in | Wt 287.0 lb

## 2019-03-06 DIAGNOSIS — R399 Unspecified symptoms and signs involving the genitourinary system: Secondary | ICD-10-CM

## 2019-03-06 LAB — POCT URINALYSIS DIPSTICK
Bilirubin, UA: NEGATIVE
Glucose, UA: NEGATIVE
Ketones, UA: NEGATIVE
Nitrite, UA: NEGATIVE
Protein, UA: POSITIVE — AB
Spec Grav, UA: 1.02 (ref 1.010–1.025)
pH, UA: 6 (ref 5.0–8.0)

## 2019-03-06 NOTE — Progress Notes (Signed)
I reviewed the note and agree with the nursing assessment and plan.   Cobie Leidner, CNM 08/05/2017 10:32 AM   

## 2019-03-06 NOTE — Progress Notes (Addendum)
Pt presents with UTI symptoms.Pt states that she has been experiencing urinary urgency, frequency, and burning x 4 days. Pt states that she has been taking AZO and it has provided some relief. Urine culture was sent to the lab. chiquita l wilson, CMA

## 2019-03-09 ENCOUNTER — Telehealth: Payer: Self-pay

## 2019-03-09 LAB — URINE CULTURE

## 2019-03-09 MED ORDER — CIPROFLOXACIN HCL 500 MG PO TABS: 500.0000 mg | ORAL_TABLET | Freq: Two times a day (BID) | ORAL | 0 refills | Status: DC

## 2019-03-09 NOTE — Telephone Encounter (Signed)
Patient made aware of Urinary tract infection. Patient made aware we will send in antibiotic for treatment for that. Kathrene Alu RN

## 2019-03-22 ENCOUNTER — Ambulatory Visit: Payer: Self-pay | Admitting: Family Medicine

## 2019-03-22 DIAGNOSIS — Z01419 Encounter for gynecological examination (general) (routine) without abnormal findings: Secondary | ICD-10-CM

## 2020-12-19 ENCOUNTER — Other Ambulatory Visit: Payer: Self-pay

## 2020-12-19 ENCOUNTER — Encounter: Payer: Self-pay | Admitting: Family Medicine

## 2020-12-19 ENCOUNTER — Ambulatory Visit (INDEPENDENT_AMBULATORY_CARE_PROVIDER_SITE_OTHER): Payer: BC Managed Care – PPO | Admitting: Family Medicine

## 2020-12-19 VITALS — BP 128/87 | HR 86 | Temp 98.3°F | Resp 18 | Ht 67.52 in | Wt 310.0 lb

## 2020-12-19 DIAGNOSIS — Z7689 Persons encountering health services in other specified circumstances: Secondary | ICD-10-CM

## 2020-12-19 DIAGNOSIS — R21 Rash and other nonspecific skin eruption: Secondary | ICD-10-CM

## 2020-12-19 DIAGNOSIS — Z6841 Body Mass Index (BMI) 40.0 and over, adult: Secondary | ICD-10-CM

## 2020-12-19 DIAGNOSIS — K219 Gastro-esophageal reflux disease without esophagitis: Secondary | ICD-10-CM | POA: Diagnosis not present

## 2020-12-19 MED ORDER — LANSOPRAZOLE 30 MG PO CPDR: 30.0000 mg | DELAYED_RELEASE_CAPSULE | Freq: Every day | ORAL | 0 refills | Status: AC

## 2020-12-19 NOTE — Progress Notes (Signed)
New Patient Office Visit  Subjective:  Patient ID: Gina Garza, female    DOB: November 21, 1985  Age: 35 y.o. MRN: [id number]  CC:  Chief Complaint  Patient presents with   Establish Care    HPI Gina Garza presents for  to establish care.  Patient is concerned because she is overweight and would like some medication for her to lose weight.  She also reports reflux symptoms and a rash behind her ears.  Past Medical History:  Diagnosis Date   History of gestational diabetes x 2    Was treated with Glyburide both times.  Had normal postpartum 2 hr GTT on 11/14/17 (after second pregnancy)   Morbid obesity with body mass index (BMI) of 40.0 or higher (HCC)     Past Surgical History:  Procedure Laterality Date   CESAREAN SECTION     preE and arrest of dilation    CESAREAN SECTION WITH BILATERAL TUBAL LIGATION Bilateral 09/30/2017   Procedure: REPEAT CESAREAN SECTION WITH BILATERAL TUBAL LIGATION;  Surgeon: Willodean Rosenthal, MD;  Location: [facility];  Service: Obstetrics;  Laterality: Bilateral;   Cysts removed from chest and under arms      Family History  Problem Relation Age of Onset   Hypertension Mother    Stroke Maternal Grandmother    Cancer Neg Hx     Social History   Socioeconomic History   Marital status: Single    Spouse name: Not on file   Number of children: Not on file   Years of education: Not on file   Highest education level: Not on file  Occupational History   Not on file  Tobacco Use   Smoking status: Never   Smokeless tobacco: Never  Vaping Use   Vaping Use: Never used  Substance and Sexual Activity   Alcohol use: No   Drug use: No   Sexual activity: Yes  Other Topics Concern   Not on file  Social History Narrative   Not on file   Social Determinants of Health   Financial Resource Strain: Not on file  Food Insecurity: Not on file  Transportation Needs: Not on file  Physical Activity: Not on file  Stress: Not on file   Social Connections: Not on file  Intimate Partner Violence: Not on file    ROS Review of Systems  Constitutional:  Negative for appetite change and unexpected weight change.  Skin:  Positive for rash.  All other systems reviewed and are negative.  Objective:   Today's Vitals: BP 128/87 (BP Location: Left Arm, Patient Position: Sitting, Cuff Size: Large)   Pulse 86   Temp 98.3 F (36.8 C)   Resp 18   Ht 5' 7.52" (1.715 m)   Wt (!) 310 lb (140.6 kg)   SpO2 97%   BMI 47.81 kg/m   Physical Exam Vitals and nursing note reviewed.  Constitutional:      General: She is not in acute distress.    Appearance: She is obese.  Cardiovascular:     Rate and Rhythm: Normal rate and regular rhythm.  Pulmonary:     Effort: Pulmonary effort is normal.     Breath sounds: Normal breath sounds.  Abdominal:     Palpations: Abdomen is soft.     Tenderness: There is no abdominal tenderness.  Skin:    Findings: Rash (Behind ears bilaterally) present.  Neurological:     General: No focal deficit present.     Mental Status: She is alert  and oriented to person, place, and time.    Assessment & Plan:   1. Class 3 severe obesity due to excess calories without serious comorbidity with body mass index (BMI) of 45.0 to 49.9 in adult [facility]) Patient referred for dietitian and weight management.  Goal for patient is 5 pounds per month weight loss - Amb ref to Medical Nutrition Therapy-MNT  2. Rash and nonspecific skin eruption Patient to utilize over-the-counter hydrocortisone to the area  3. Gastroesophageal reflux disease without esophagitis Patient was prescribed Prevacid and diet adjustments were discussed  4. Encounter to establish care    Outpatient Encounter Medications as of 12/19/2020  Medication Sig   lansoprazole (PREVACID) 30 MG capsule Take 1 capsule (30 mg total) by mouth daily at 12 noon.   [DISCONTINUED] ciprofloxacin (CIPRO) 500 MG tablet Take 1 tablet (500 mg total) by mouth  2 (two) times daily.   [DISCONTINUED] NIFEdipine (PROCARDIA-XL/ADALAT-CC/NIFEDICAL-XL) 30 MG 24 hr tablet Take 1 tablet (30 mg total) by mouth daily. (Patient not taking: Reported on 03/06/2019)   [DISCONTINUED] Prenatal Vit-Fe Fumarate-FA (MULTIVITAMIN-PRENATAL) 27-0.8 MG TABS tablet Take 1 tablet by mouth daily at 12 noon.   No facility-administered encounter medications on file as of 12/19/2020.    Follow-up: Return in about 3 months (around 03/21/2021) for follow up, physical.   Tommie Raymond, MD

## 2020-12-19 NOTE — Progress Notes (Signed)
Pt presents to establish care reports that she has rash behind both ears going on approx 2 months symptoms include itching and mild pain  Pt wants to discuss weight loss medications

## 2021-03-27 ENCOUNTER — Encounter: Payer: BC Managed Care – PPO | Admitting: Family Medicine

## 2022-05-11 DIAGNOSIS — L03116 Cellulitis of left lower limb: Secondary | ICD-10-CM | POA: Diagnosis not present

## 2022-06-22 ENCOUNTER — Encounter: Payer: Self-pay | Admitting: Family Medicine

## 2022-06-22 ENCOUNTER — Ambulatory Visit (INDEPENDENT_AMBULATORY_CARE_PROVIDER_SITE_OTHER): Payer: BC Managed Care – PPO | Admitting: Family Medicine

## 2022-06-22 VITALS — BP 148/102 | HR 95 | Temp 98.1°F | Resp 16 | Ht 67.5 in | Wt 324.0 lb

## 2022-06-22 DIAGNOSIS — Z6841 Body Mass Index (BMI) 40.0 and over, adult: Secondary | ICD-10-CM | POA: Diagnosis not present

## 2022-06-22 DIAGNOSIS — I10 Essential (primary) hypertension: Secondary | ICD-10-CM

## 2022-06-22 DIAGNOSIS — E6609 Other obesity due to excess calories: Secondary | ICD-10-CM | POA: Diagnosis not present

## 2022-06-22 DIAGNOSIS — Z0001 Encounter for general adult medical examination with abnormal findings: Secondary | ICD-10-CM

## 2022-06-22 DIAGNOSIS — Z0000 Encounter for general adult medical examination without abnormal findings: Secondary | ICD-10-CM | POA: Diagnosis not present

## 2022-06-22 DIAGNOSIS — Z130 Encounter for screening for diseases of the blood and blood-forming organs and certain disorders involving the immune mechanism: Secondary | ICD-10-CM | POA: Diagnosis not present

## 2022-06-22 DIAGNOSIS — Z13220 Encounter for screening for lipoid disorders: Secondary | ICD-10-CM | POA: Diagnosis not present

## 2022-06-22 DIAGNOSIS — Z1329 Encounter for screening for other suspected endocrine disorder: Secondary | ICD-10-CM | POA: Diagnosis not present

## 2022-06-22 DIAGNOSIS — Z1159 Encounter for screening for other viral diseases: Secondary | ICD-10-CM

## 2022-06-22 MED ORDER — LISINOPRIL 10 MG PO TABS: 10.0000 mg | ORAL_TABLET | Freq: Every day | ORAL | 1 refills | Status: DC

## 2022-06-22 NOTE — Progress Notes (Unsigned)
Patient is here for .well women CPE w/out pap. Patient has no other concerns today

## 2022-06-23 ENCOUNTER — Encounter: Payer: Self-pay | Admitting: Family Medicine

## 2022-06-23 LAB — CBC WITH DIFFERENTIAL/PLATELET
Basophils Absolute: 0.1 10*3/uL (ref 0.0–0.2)
Basos: 1 %
EOS (ABSOLUTE): 0.4 10*3/uL (ref 0.0–0.4)
Eos: 4 %
Hematocrit: 36.8 % (ref 34.0–46.6)
Hemoglobin: 12 g/dL (ref 11.1–15.9)
Immature Grans (Abs): 0 10*3/uL (ref 0.0–0.1)
Immature Granulocytes: 0 %
Lymphocytes Absolute: 3.7 10*3/uL — ABNORMAL HIGH (ref 0.7–3.1)
Lymphs: 34 %
MCH: 26.1 pg — ABNORMAL LOW (ref 26.6–33.0)
MCHC: 32.6 g/dL (ref 31.5–35.7)
MCV: 80 fL (ref 79–97)
Monocytes Absolute: 0.5 10*3/uL (ref 0.1–0.9)
Monocytes: 5 %
Neutrophils Absolute: 6.1 10*3/uL (ref 1.4–7.0)
Neutrophils: 56 %
Platelets: 368 10*3/uL (ref 150–450)
RBC: 4.59 x10E6/uL (ref 3.77–5.28)
RDW: 14.4 % (ref 11.7–15.4)
WBC: 10.7 10*3/uL (ref 3.4–10.8)

## 2022-06-23 LAB — LIPID PANEL
Chol/HDL Ratio: 4.1 ratio (ref 0.0–4.4)
Cholesterol, Total: 172 mg/dL (ref 100–199)
HDL: 42 mg/dL (ref >39)
LDL Chol Calc (NIH): 117 mg/dL — ABNORMAL HIGH (ref 0–99)
Triglycerides: 70 mg/dL (ref 0–149)
VLDL Cholesterol Cal: 13 mg/dL (ref 5–40)

## 2022-06-23 LAB — CMP14+EGFR
ALT: 5 IU/L (ref 0–32)
AST: 13 IU/L (ref 0–40)
Albumin/Globulin Ratio: 1.4 (ref 1.2–2.2)
Albumin: 4.3 g/dL (ref 3.9–4.9)
Alkaline Phosphatase: 81 IU/L (ref 44–121)
BUN/Creatinine Ratio: 13 (ref 9–23)
BUN: 10 mg/dL (ref 6–20)
Bilirubin Total: <0.2 mg/dL (ref 0.0–1.2)
CO2: 21 mmol/L (ref 20–29)
Calcium: 9.6 mg/dL (ref 8.7–10.2)
Chloride: 102 mmol/L (ref 96–106)
Creatinine, Ser: 0.8 mg/dL (ref 0.57–1.00)
Globulin, Total: 3 g/dL (ref 1.5–4.5)
Glucose: 118 mg/dL — ABNORMAL HIGH (ref 70–99)
Potassium: 4.4 mmol/L (ref 3.5–5.2)
Sodium: 139 mmol/L (ref 134–144)
Total Protein: 7.3 g/dL (ref 6.0–8.5)
eGFR: 99 mL/min/{1.73_m2} (ref >59)

## 2022-06-23 LAB — TSH: TSH: 2.54 u[IU]/mL (ref 0.450–4.500)

## 2022-06-23 NOTE — Progress Notes (Signed)
Established Patient Office Visit  Subjective    Patient ID: Gina Garza, female    DOB: 1985-07-23  Age: 37 y.o. MRN: [id number]  CC:  Chief Complaint  Patient presents with   Annual Exam    HPI Gina Garza presents for routine annual exam. Patient denies acute complaints or concerns.    Outpatient Encounter Medications as of 06/22/2022  Medication Sig   lisinopril (ZESTRIL) 10 MG tablet Take 1 tablet (10 mg total) by mouth daily.   lansoprazole (PREVACID) 30 MG capsule Take 1 capsule (30 mg total) by mouth daily at 12 noon. (Patient not taking: Reported on 06/22/2022)   No facility-administered encounter medications on file as of 06/22/2022.    Past Medical History:  Diagnosis Date   History of gestational diabetes x 2    Was treated with Glyburide both times.  Had normal postpartum 2 hr GTT on 11/14/17 (after second pregnancy)   Morbid obesity with body mass index (BMI) of 40.0 or higher (HCC)     Past Surgical History:  Procedure Laterality Date   CESAREAN SECTION     preE and arrest of dilation    CESAREAN SECTION WITH BILATERAL TUBAL LIGATION Bilateral 09/30/2017   Procedure: REPEAT CESAREAN SECTION WITH BILATERAL TUBAL LIGATION;  Surgeon: Lavonia Drafts, MD;  Location: [place];  Service: Obstetrics;  Laterality: Bilateral;   Cysts removed from chest and under arms      Family History  Problem Relation Age of Onset   Hypertension Mother    Stroke Maternal Grandmother    Cancer Neg Hx     Social History   Socioeconomic History   Marital status: Single    Spouse name: Not on file   Number of children: Not on file   Years of education: Not on file   Highest education level: Not on file  Occupational History   Not on file  Tobacco Use   Smoking status: Never   Smokeless tobacco: Never  Vaping Use   Vaping Use: Never used  Substance and Sexual Activity   Alcohol use: No   Drug use: No   Sexual activity: Yes  Other Topics  Concern   Not on file  Social History Narrative   Not on file   Social Determinants of Health   Financial Resource Strain: Not on file  Food Insecurity: Not on file  Transportation Needs: Not on file  Physical Activity: Not on file  Stress: Not on file  Social Connections: Not on file  Intimate Partner Violence: Not on file    Review of Systems  All other systems reviewed and are negative.       Objective    BP (!) 148/102   Pulse 95   Temp 98.1 F (36.7 C) (Oral)   Resp 16   Ht 5' 7.5" (1.715 m)   Wt (!) 324 lb (147 kg)   SpO2 96%   BMI 50.00 kg/m   Physical Exam Vitals and nursing note reviewed.  Constitutional:      General: She is not in acute distress.    Appearance: She is obese.  HENT:     Head: Normocephalic and atraumatic.     Right Ear: Tympanic membrane, ear canal and external ear normal.     Left Ear: Tympanic membrane, ear canal and external ear normal.     Nose: Nose normal.     Mouth/Throat:     Mouth: Mucous membranes are moist.     Pharynx:  Oropharynx is clear.  Eyes:     Conjunctiva/sclera: Conjunctivae normal.     Pupils: Pupils are equal, round, and reactive to light.  Neck:     Thyroid: No thyromegaly.  Cardiovascular:     Rate and Rhythm: Normal rate and regular rhythm.     Heart sounds: Normal heart sounds. No murmur heard. Pulmonary:     Effort: Pulmonary effort is normal. No respiratory distress.     Breath sounds: Normal breath sounds.  Abdominal:     General: There is no distension.     Palpations: Abdomen is soft. There is no mass.     Tenderness: There is no abdominal tenderness.  Musculoskeletal:        General: Normal range of motion.     Cervical back: Normal range of motion and neck supple.  Skin:    General: Skin is warm and dry.  Neurological:     General: No focal deficit present.     Mental Status: She is alert and oriented to person, place, and time.  Psychiatric:        Mood and Affect: Mood normal.         Behavior: Behavior normal.         Assessment & Plan:    1. Annual physical exam  - CMP14+EGFR  2. Screening for deficiency anemia  - CBC with Differential  3. Screening for lipid disorders  - Lipid Panel  4. Screening for endocrine/metabolic/immunity disorders  - TSH  5. Need for hepatitis C screening test   6. Essential hypertension Elevated reading. Lisinopril 10 mg prescribed. monitor   Return in about 2 weeks (around 07/06/2022) for follow up.   Becky Sax, MD

## 2022-07-09 ENCOUNTER — Encounter: Payer: Self-pay | Admitting: Family Medicine

## 2022-07-09 ENCOUNTER — Ambulatory Visit (INDEPENDENT_AMBULATORY_CARE_PROVIDER_SITE_OTHER): Payer: BC Managed Care – PPO | Admitting: Family Medicine

## 2022-07-09 VITALS — BP 127/89 | HR 93 | Resp 16

## 2022-07-09 DIAGNOSIS — I10 Essential (primary) hypertension: Secondary | ICD-10-CM | POA: Diagnosis not present

## 2022-07-09 DIAGNOSIS — Z7689 Persons encountering health services in other specified circumstances: Secondary | ICD-10-CM

## 2022-07-09 DIAGNOSIS — Z6841 Body Mass Index (BMI) 40.0 and over, adult: Secondary | ICD-10-CM | POA: Diagnosis not present

## 2022-07-09 MED ORDER — PHENTERMINE HCL 37.5 MG PO CAPS: 37.5000 mg | ORAL_CAPSULE | ORAL | 0 refills | Status: DC

## 2022-07-09 MED ORDER — LISINOPRIL 10 MG PO TABS: 10.0000 mg | ORAL_TABLET | Freq: Every day | ORAL | 0 refills | Status: DC

## 2022-07-10 NOTE — Progress Notes (Unsigned)
Patient is here for 2wk follow-up BP Patent has uncontrolled hypertension Provider is  aware of readings  

## 2022-07-12 ENCOUNTER — Encounter: Payer: Self-pay | Admitting: Family Medicine

## 2022-07-12 NOTE — Progress Notes (Signed)
Established Patient Office Visit  Subjective    Patient ID: Gina Garza, female    DOB: 04-15-1986  Age: 37 y.o. MRN: [id number]  CC:  Chief Complaint  Patient presents with   Follow-up   Hypertension    HPI Gina Garza presents for follow up of hypertension and wt management.    Outpatient Encounter Medications as of 07/09/2022  Medication Sig   phentermine 37.5 MG capsule Take 1 capsule (37.5 mg total) by mouth every morning.   lansoprazole (PREVACID) 30 MG capsule Take 1 capsule (30 mg total) by mouth daily at 12 noon. (Patient not taking: Reported on 06/22/2022)   lisinopril (ZESTRIL) 10 MG tablet Take 1 tablet (10 mg total) by mouth daily.   [DISCONTINUED] lisinopril (ZESTRIL) 10 MG tablet Take 1 tablet (10 mg total) by mouth daily.   No facility-administered encounter medications on file as of 07/09/2022.    Past Medical History:  Diagnosis Date   History of gestational diabetes x 2    Was treated with Glyburide both times.  Had normal postpartum 2 hr GTT on 11/14/17 (after second pregnancy)   Morbid obesity with body mass index (BMI) of 40.0 or higher (HCC)     Past Surgical History:  Procedure Laterality Date   CESAREAN SECTION     preE and arrest of dilation    CESAREAN SECTION WITH BILATERAL TUBAL LIGATION Bilateral 09/30/2017   Procedure: REPEAT CESAREAN SECTION WITH BILATERAL TUBAL LIGATION;  Surgeon: Lavonia Drafts, MD;  Location: [place];  Service: Obstetrics;  Laterality: Bilateral;   Cysts removed from chest and under arms      Family History  Problem Relation Age of Onset   Hypertension Mother    Stroke Maternal Grandmother    Cancer Neg Hx     Social History   Socioeconomic History   Marital status: Single    Spouse name: Not on file   Number of children: Not on file   Years of education: Not on file   Highest education level: Not on file  Occupational History   Not on file  Tobacco Use   Smoking status: Never    Smokeless tobacco: Never  Vaping Use   Vaping Use: Never used  Substance and Sexual Activity   Alcohol use: No   Drug use: No   Sexual activity: Yes  Other Topics Concern   Not on file  Social History Narrative   Not on file   Social Determinants of Health   Financial Resource Strain: Not on file  Food Insecurity: Not on file  Transportation Needs: Not on file  Physical Activity: Not on file  Stress: Not on file  Social Connections: Not on file  Intimate Partner Violence: Not on file    Review of Systems  All other systems reviewed and are negative.       Objective    BP 127/89   Pulse 93   Resp 16   SpO2 97%   Physical Exam Vitals and nursing note reviewed.  Constitutional:      General: She is not in acute distress. Cardiovascular:     Rate and Rhythm: Normal rate and regular rhythm.  Pulmonary:     Effort: Pulmonary effort is normal.     Breath sounds: Normal breath sounds.  Abdominal:     Palpations: Abdomen is soft.     Tenderness: There is no abdominal tenderness.  Neurological:     General: No focal deficit present.  Mental Status: She is alert and oriented to person, place, and time.         Assessment & Plan:   1. Essential hypertension Much improved with present management. Continue   2. Encounter for weight management Phentermine prescribed with goal of 4-6lbs/wt loss month  3. Class 3 severe obesity due to excess calories without serious comorbidity with body mass index (BMI) of 45.0 to 49.9 in adult [facility])     Return in about 4 weeks (around 08/06/2022) for follow up.   Becky Sax, MD

## 2022-08-13 ENCOUNTER — Ambulatory Visit (INDEPENDENT_AMBULATORY_CARE_PROVIDER_SITE_OTHER): Payer: BC Managed Care – PPO | Admitting: Family Medicine

## 2022-08-13 ENCOUNTER — Encounter: Payer: Self-pay | Admitting: Family Medicine

## 2022-08-13 VITALS — BP 140/92 | HR 110 | Temp 98.1°F | Resp 16 | Wt 310.0 lb

## 2022-08-13 DIAGNOSIS — I10 Essential (primary) hypertension: Secondary | ICD-10-CM | POA: Diagnosis not present

## 2022-08-13 DIAGNOSIS — Z6841 Body Mass Index (BMI) 40.0 and over, adult: Secondary | ICD-10-CM

## 2022-08-13 DIAGNOSIS — Z7689 Persons encountering health services in other specified circumstances: Secondary | ICD-10-CM | POA: Diagnosis not present

## 2022-08-13 DIAGNOSIS — E66813 Obesity, class 3: Secondary | ICD-10-CM

## 2022-08-13 MED ORDER — PHENTERMINE HCL 37.5 MG PO CAPS: 37.5000 mg | ORAL_CAPSULE | ORAL | 0 refills | Status: DC

## 2022-08-13 NOTE — Progress Notes (Signed)
Patient came in for monthly weight check. Patient has no other concerns today  

## 2022-08-13 NOTE — Progress Notes (Signed)
Established Patient Office Visit  Subjective    Patient ID: Gina Garza, female    DOB: 1986/03/14  Age: 37 y.o. MRN: [id number]  CC:  Chief Complaint  Patient presents with   Follow-up    HPI Gina Garza presents for routine weight management. Patient denies acute complaints or concerns.    Outpatient Encounter Medications as of 08/13/2022  Medication Sig   lisinopril (ZESTRIL) 10 MG tablet Take 1 tablet (10 mg total) by mouth daily.   [DISCONTINUED] phentermine 37.5 MG capsule Take 1 capsule (37.5 mg total) by mouth every morning.   lansoprazole (PREVACID) 30 MG capsule Take 1 capsule (30 mg total) by mouth daily at 12 noon. (Patient not taking: Reported on 06/22/2022)   phentermine 37.5 MG capsule Take 1 capsule (37.5 mg total) by mouth every morning.   No facility-administered encounter medications on file as of 08/13/2022.    Past Medical History:  Diagnosis Date   History of gestational diabetes x 2    Was treated with Glyburide both times.  Had normal postpartum 2 hr GTT on 11/14/17 (after second pregnancy)   Morbid obesity with body mass index (BMI) of 40.0 or higher     Past Surgical History:  Procedure Laterality Date   CESAREAN SECTION     preE and arrest of dilation    CESAREAN SECTION WITH BILATERAL TUBAL LIGATION Bilateral 09/30/2017   Procedure: REPEAT CESAREAN SECTION WITH BILATERAL TUBAL LIGATION;  Surgeon: Willodean RosenthalHarraway-Smith, Carolyn, MD;  Location: [facility];  Service: Obstetrics;  Laterality: Bilateral;   Cysts removed from chest and under arms      Family History  Problem Relation Age of Onset   Hypertension Mother    Stroke Maternal Grandmother    Cancer Neg Hx     Social History   Socioeconomic History   Marital status: Single    Spouse name: Not on file   Number of children: Not on file   Years of education: Not on file   Highest education level: Not on file  Occupational History   Not on file  Tobacco Use   Smoking status:  Never   Smokeless tobacco: Never  Vaping Use   Vaping Use: Never used  Substance and Sexual Activity   Alcohol use: No   Drug use: No   Sexual activity: Yes  Other Topics Concern   Not on file  Social History Narrative   Not on file   Social Determinants of Health   Financial Resource Strain: Not on file  Food Insecurity: Not on file  Transportation Needs: Not on file  Physical Activity: Not on file  Stress: Not on file  Social Connections: Not on file  Intimate Partner Violence: Not on file    Review of Systems  All other systems reviewed and are negative.       Objective    BP (!) 140/92   Pulse (!) 110   Temp 98.1 F (36.7 C) (Oral)   Resp 16   Wt (!) 310 lb (140.6 kg)   SpO2 96%   BMI 47.84 kg/m   Physical Exam Vitals and nursing note reviewed.  Constitutional:      General: She is not in acute distress.    Appearance: She is obese.  Cardiovascular:     Rate and Rhythm: Normal rate and regular rhythm.  Pulmonary:     Effort: Pulmonary effort is normal.     Breath sounds: Normal breath sounds.  Neurological:  General: No focal deficit present.     Mental Status: She is alert and oriented to person, place, and time.         Assessment & Plan:   1. Encounter for weight management Doing well with present management. Phentermine refilled.   2. Class 3 severe obesity due to excess calories without serious comorbidity with body mass index (BMI) of 45.0 to 49.9 in adult   3. Essential hypertension Slightly elevated blood pressure. Will monitor.     Return in about 4 weeks (around 09/10/2022) for follow up.   Tommie Raymond, MD

## 2022-09-24 ENCOUNTER — Encounter: Payer: Self-pay | Admitting: Family Medicine

## 2022-09-24 ENCOUNTER — Ambulatory Visit (INDEPENDENT_AMBULATORY_CARE_PROVIDER_SITE_OTHER): Payer: BC Managed Care – PPO | Admitting: Family Medicine

## 2022-09-24 VITALS — BP 131/91 | HR 106 | Temp 98.1°F | Resp 16 | Wt 294.0 lb

## 2022-09-24 DIAGNOSIS — Z6841 Body Mass Index (BMI) 40.0 and over, adult: Secondary | ICD-10-CM

## 2022-09-24 DIAGNOSIS — Z7689 Persons encountering health services in other specified circumstances: Secondary | ICD-10-CM

## 2022-09-24 DIAGNOSIS — I10 Essential (primary) hypertension: Secondary | ICD-10-CM | POA: Diagnosis not present

## 2022-09-24 MED ORDER — PHENTERMINE HCL 37.5 MG PO CAPS: 37.5000 mg | ORAL_CAPSULE | ORAL | 0 refills | Status: DC

## 2022-09-24 NOTE — Progress Notes (Unsigned)
New Patient Office Visit  Subjective    Patient ID: Gina Garza, female    DOB: 26-May-1985  Age: 37 y.o. MRN: [id number]  CC: No chief complaint on file.   HPI Gina Garza presents to establish care ***  Outpatient Encounter Medications as of 09/24/2022  Medication Sig   lansoprazole (PREVACID) 30 MG capsule Take 1 capsule (30 mg total) by mouth daily at 12 noon. (Patient not taking: Reported on 06/22/2022)   lisinopril (ZESTRIL) 10 MG tablet Take 1 tablet (10 mg total) by mouth daily.   phentermine 37.5 MG capsule Take 1 capsule (37.5 mg total) by mouth every morning.   No facility-administered encounter medications on file as of 09/24/2022.    Past Medical History:  Diagnosis Date   History of gestational diabetes x 2    Was treated with Glyburide both times.  Had normal postpartum 2 hr GTT on 11/14/17 (after second pregnancy)   Morbid obesity with body mass index (BMI) of 40.0 or higher (HCC)     Past Surgical History:  Procedure Laterality Date   CESAREAN SECTION     preE and arrest of dilation    CESAREAN SECTION WITH BILATERAL TUBAL LIGATION Bilateral 09/30/2017   Procedure: REPEAT CESAREAN SECTION WITH BILATERAL TUBAL LIGATION;  Surgeon: Willodean Rosenthal, MD;  Location: [facility];  Service: Obstetrics;  Laterality: Bilateral;   Cysts removed from chest and under arms      Family History  Problem Relation Age of Onset   Hypertension Mother    Stroke Maternal Grandmother    Cancer Neg Hx     Social History   Socioeconomic History   Marital status: Single    Spouse name: Not on file   Number of children: Not on file   Years of education: Not on file   Highest education level: Associate degree: academic program  Occupational History   Not on file  Tobacco Use   Smoking status: Never   Smokeless tobacco: Never  Vaping Use   Vaping Use: Never used  Substance and Sexual Activity   Alcohol use: No   Drug use: No   Sexual activity:  Yes  Other Topics Concern   Not on file  Social History Narrative   Not on file   Social Determinants of Health   Financial Resource Strain: Medium Risk (09/20/2022)   Overall Financial Resource Strain (CARDIA)    Difficulty of Paying Living Expenses: Somewhat hard  Food Insecurity: No Food Insecurity (09/20/2022)   Hunger Vital Sign    Worried About Running Out of Food in the Last Year: Never true    Ran Out of Food in the Last Year: Never true  Transportation Needs: No Transportation Needs (09/20/2022)   PRAPARE - Administrator, Civil Service (Medical): No    Lack of Transportation (Non-Medical): No  Physical Activity: Sufficiently Active (09/20/2022)   Exercise Vital Sign    Days of Exercise per Week: 3 days    Minutes of Exercise per Session: 60 min  Stress: No Stress Concern Present (09/20/2022)   [employer] of Occupational Health - Occupational Stress Questionnaire    Feeling of Stress : Only a little  Social Connections: Moderately Isolated (09/20/2022)   Social Connection and Isolation Panel [NHANES]    Frequency of Communication with Friends and Family: More than three times a week    Frequency of Social Gatherings with Friends and Family: Once a week    Attends Religious Services: 1  to 4 times per year    Active Member of Clubs or Organizations: No    Attends Engineer, structural: Not on file    Marital Status: Never married  Intimate Partner Violence: Not on file    ROS      Objective    BP (!) 131/91   Pulse (!) 106   Temp 98.1 F (36.7 C) (Oral)   Resp 16   Wt 294 lb (133.4 kg)   SpO2 98%   BMI 45.37 kg/m   Physical Exam  {Labs (Optional):23779}    Assessment & Plan:   Problem List Items Addressed This Visit   None Visit Diagnoses     Encounter for weight management    -  Primary   Class 3 severe obesity due to excess calories without serious comorbidity with body mass index (BMI) of 45.0 to 49.9 in adult [facility])            No follow-ups on file.   Tommie Raymond, MD

## 2022-09-29 ENCOUNTER — Encounter: Payer: Self-pay | Admitting: Family Medicine

## 2022-10-28 ENCOUNTER — Other Ambulatory Visit: Payer: Self-pay | Admitting: Family Medicine

## 2022-10-28 ENCOUNTER — Emergency Department (HOSPITAL_COMMUNITY)
Admission: EM | Admit: 2022-10-28 | Discharge: 2022-10-28 | Disposition: A | Discharge status: Home / Self Care | Payer: BC Managed Care – PPO | Attending: Emergency Medicine | Admitting: Emergency Medicine

## 2022-10-28 ENCOUNTER — Other Ambulatory Visit: Payer: Self-pay

## 2022-10-28 ENCOUNTER — Encounter (HOSPITAL_COMMUNITY): Payer: Self-pay

## 2022-10-28 DIAGNOSIS — I10 Essential (primary) hypertension: Secondary | ICD-10-CM | POA: Insufficient documentation

## 2022-10-28 DIAGNOSIS — N611 Abscess of the breast and nipple: Secondary | ICD-10-CM | POA: Diagnosis not present

## 2022-10-28 DIAGNOSIS — N644 Mastodynia: Secondary | ICD-10-CM | POA: Diagnosis not present

## 2022-10-28 DIAGNOSIS — Z79899 Other long term (current) drug therapy: Secondary | ICD-10-CM | POA: Insufficient documentation

## 2022-10-28 MED ORDER — DOXYCYCLINE HYCLATE 100 MG PO CAPS: 100.0000 mg | ORAL_CAPSULE | Freq: Two times a day (BID) | ORAL | 0 refills | Status: AC

## 2022-10-28 MED ORDER — HYDROCODONE-ACETAMINOPHEN 5-325 MG PO TABS
1.0000 | ORAL_TABLET | Freq: Once | ORAL | Status: AC
  Administered 2022-10-28: 1 via ORAL
  Filled 2022-10-28: qty 1

## 2022-10-28 MED ORDER — IBUPROFEN 600 MG PO TABS: 600.0000 mg | ORAL_TABLET | Freq: Four times a day (QID) | ORAL | 0 refills | Status: AC | PRN

## 2022-10-28 MED ORDER — DOXYCYCLINE HYCLATE 100 MG PO TABS
100.0000 mg | ORAL_TABLET | Freq: Once | ORAL | Status: AC
  Administered 2022-10-28: 100 mg via ORAL
  Filled 2022-10-28: qty 1

## 2022-10-28 NOTE — Discharge Instructions (Signed)
As discussed, warm compresses over abscess to continue drainage.  He may take Tylenol/Motrin as needed for pain as well as continue antibiotic in the form of doxycycline to treat infection.  Recommend follow-up in the breast center for reevaluation if need further treatment.  Please do not hesitate to return to emergency department for worrisome signs and symptoms we discussed become apparent.

## 2022-10-28 NOTE — ED Triage Notes (Signed)
Abscess to left side of breast for about a week. Pt denies fever, chills, body aches. Pt has HS and gets frequent boils/abscesses. UC sent pt due to abscess being complex per the pt.

## 2022-10-28 NOTE — ED Provider Notes (Signed)
[place] EMERGENCY DEPARTMENT AT [facility] Provider Note   CSN: [id number] Arrival date & time: 10/28/22  1835     History  Chief Complaint  Patient presents with   Abscess    Gina Garza is a 37 y.o. female.   Abscess   37 year old female presents emergency department with complaints of left-sided breast abscess.  Patient reports history of breast abscesses occurring approximately every 6 months or so.  Reports this morning occurring over the past week with noticing "coming to ahead" over the past 2 days.  Was seen by urgent care earlier today and sent to the emergency department for further assessment/evaluation.  Patient states en route, abscess began to drain.  Has not been placed on antibiotics.  Denies fever, chills, nausea, vomiting.  Patient up-to-date on tetanus.  Past medical history significant for morbid obesity, hypertension  Home Medications Prior to Admission medications   Medication Sig Start Date End Date Taking? Authorizing Provider  doxycycline (VIBRAMYCIN) 100 MG capsule Take 1 capsule (100 mg total) by mouth 2 (two) times daily. 10/28/22  Yes Sherian Maroon A, PA  ibuprofen (ADVIL) 600 MG tablet Take 1 tablet (600 mg total) by mouth every 6 (six) hours as needed. 10/28/22  Yes Sherian Maroon A, PA  lansoprazole (PREVACID) 30 MG capsule Take 1 capsule (30 mg total) by mouth daily at 12 noon. Patient not taking: Reported on 06/22/2022 12/19/20   Georganna Skeans, MD  lisinopril (ZESTRIL) 10 MG tablet TAKE 1 TABLET BY MOUTH EVERY DAY 10/28/22   Georganna Skeans, MD  phentermine 37.5 MG capsule Take 1 capsule (37.5 mg total) by mouth every morning. 09/24/22   Georganna Skeans, MD      Allergies    Patient has no known allergies.    Review of Systems   Review of Systems  All other systems reviewed and are negative.   Physical Exam Updated Vital Signs BP (!) 156/96 (BP Location: Right Arm)   Pulse 97   Temp 98.3 F (36.8 C) (Oral)   Resp 17    Ht 5' 7.5" (1.715 m)   Wt 128.8 kg   SpO2 100%   BMI 43.82 kg/m  Physical Exam Vitals and nursing note reviewed. Exam conducted with a chaperone present.  Constitutional:      General: She is not in acute distress.    Appearance: She is well-developed.  HENT:     Head: Normocephalic and atraumatic.  Eyes:     Conjunctiva/sclera: Conjunctivae normal.  Cardiovascular:     Rate and Rhythm: Normal rate and regular rhythm.     Heart sounds: No murmur heard. Pulmonary:     Effort: Pulmonary effort is normal. No respiratory distress.     Breath sounds: Normal breath sounds.  Abdominal:     Palpations: Abdomen is soft.     Tenderness: There is no abdominal tenderness.  Musculoskeletal:        General: No swelling.     Cervical back: Neck supple.  Skin:    General: Skin is warm and dry.     Capillary Refill: Capillary refill takes less than 2 seconds.     Comments: Patient with area of erythema on the underside of left breast with opening appreciated approximately 1 cm in diameter with purulent drainage appreciated.  Overlying warmth to touch as well as tenderness to palpation.  Neurological:     Mental Status: She is alert.  Psychiatric:        Mood and Affect: Mood  normal.     ED Results / Procedures / Treatments   Labs (all labs ordered are listed, but only abnormal results are displayed) Labs Reviewed - No data to display  EKG None  Radiology No results found.  Procedures .Marland KitchenIncision and Drainage  Date/Time: 10/28/2022 7:22 PM  Performed by: Peter Garter, PA Authorized by: Peter Garter, PA   Consent:    Consent obtained:  Verbal   Consent given by:  Patient   Risks, benefits, and alternatives were discussed: yes     Risks discussed:  Bleeding, damage to other organs, infection, incomplete drainage and pain   Alternatives discussed:  No treatment, delayed treatment, alternative treatment, observation and referral Universal protocol:    Procedure  explained and questions answered to patient or proxy's satisfaction: yes     Patient identity confirmed:  Verbally with patient Location:    Type:  Abscess   Size:  3.0   Location: Left breast. Pre-procedure details:    Skin preparation:  Chlorhexidine Sedation:    Sedation type:  None Anesthesia:    Anesthesia method:  None Procedure type:    Complexity:  Simple Procedure details:    Ultrasound guidance: no     Needle aspiration: no     Incision type: No incision made. Area open and draining.   Wound management:  Probed and deloculated and irrigated with saline   Drainage:  Bloody and purulent   Drainage amount:  Copious   Wound treatment:  Drain placed   Packing materials:  None Post-procedure details:    Procedure completion:  Tolerated well, no immediate complications     Medications Ordered in ED Medications  doxycycline (VIBRA-TABS) tablet 100 mg (100 mg Oral Given 10/28/22 1920)  HYDROcodone-acetaminophen (NORCO/VICODIN) 5-325 MG per tablet 1 tablet (1 tablet Oral Given 10/28/22 1920)    ED Course/ Medical Decision Making/ A&P                             Medical Decision Making Risk Prescription drug management.   This patient presents to the ED for concern of breast abscess, this involves an extensive number of treatment options, and is a complaint that carries with it a high risk of complications and morbidity.  The differential diagnosis includes cellulitis, erysipelas, abscess, fasciitis, malignancy, lymph node   Co morbidities that complicate the patient evaluation  See HPI   Additional history obtained:  Additional history obtained from EMR External records from outside source obtained and reviewed including hospital records   Lab Tests:  N/a   Imaging Studies ordered:  N/a   Cardiac Monitoring: / EKG:  The patient was maintained on a cardiac monitor.  I personally viewed and interpreted the cardiac monitored which showed an underlying  rhythm of: Sinus rhythm   Consultations Obtained:  N/a   Problem List / ED Course / Critical interventions / Medication management  Breast abscess I ordered medication including Norco, doxycycline   Reevaluation of the patient after these medicines showed that the patient improved I have reviewed the patients home medicines and have made adjustments as needed   Social Determinants of Health:  Denies tobacco, illicit drug use   Test / Admission - Considered:  Breast abscess Vitals signs significant for hypertension blood pressure 156/96. Otherwise within normal range and stable throughout visit. 37 year old female presents emergency department with complaints of left-sided breast abscess.  Abscess began to drain spontaneously prior to visit emergency department.  Area was further irrigated/evaluated in manner as depicted above.  Given surrounding erythema, patient will place empirically on antibiotics and recommend follow-up with the breast center in the outpatient setting.  Patient does not meet SIRS criteria.  Further workup deemed unnecessary at this time while emergency department.  Treatment plan discussed at length with patient and she acknowledged understanding was agreeable to said plan.  Patient overall well-appearing, afebrile in no acute distress. Worrisome signs and symptoms were discussed with the patient, and the patient acknowledged understanding to return to the ED if noticed. Patient was stable upon discharge.          Final Clinical Impression(s) / ED Diagnoses Final diagnoses:  Breast abscess    Rx / DC Orders ED Discharge Orders          Ordered    doxycycline (VIBRAMYCIN) 100 MG capsule  2 times daily        10/28/22 2004    ibuprofen (ADVIL) 600 MG tablet  Every 6 hours PRN        10/28/22 2004              Peter Garter, [place] 10/28/22 2012    Melene Plan, DO 10/28/22 2105

## 2022-10-28 NOTE — ED Notes (Signed)
Pt teaching provided on medications that may cause drowsiness. Pt instructed not to drive or operate heavy machinery while taking the prescribed medication. Pt verbalized understanding.   Pt provided discharge instructions and prescription information. Pt was given the opportunity to ask questions and questions were answered.   

## 2022-10-30 DIAGNOSIS — N644 Mastodynia: Secondary | ICD-10-CM | POA: Diagnosis not present

## 2022-10-30 DIAGNOSIS — N611 Abscess of the breast and nipple: Secondary | ICD-10-CM | POA: Diagnosis not present

## 2022-11-01 ENCOUNTER — Telehealth: Payer: Self-pay | Admitting: Family Medicine

## 2022-11-01 ENCOUNTER — Ambulatory Visit: Payer: Self-pay | Admitting: *Deleted

## 2022-11-01 ENCOUNTER — Ambulatory Visit (INDEPENDENT_AMBULATORY_CARE_PROVIDER_SITE_OTHER): Payer: BC Managed Care – PPO | Admitting: Family Medicine

## 2022-11-01 VITALS — BP 153/92 | HR 90 | Temp 98.1°F | Resp 16 | Wt 282.6 lb

## 2022-11-01 DIAGNOSIS — Z6841 Body Mass Index (BMI) 40.0 and over, adult: Secondary | ICD-10-CM

## 2022-11-01 DIAGNOSIS — L732 Hidradenitis suppurativa: Secondary | ICD-10-CM

## 2022-11-01 DIAGNOSIS — I10 Essential (primary) hypertension: Secondary | ICD-10-CM | POA: Diagnosis not present

## 2022-11-01 DIAGNOSIS — Z0289 Encounter for other administrative examinations: Secondary | ICD-10-CM

## 2022-11-01 DIAGNOSIS — E66813 Obesity, class 3: Secondary | ICD-10-CM

## 2022-11-01 DIAGNOSIS — Z7689 Persons encountering health services in other specified circumstances: Secondary | ICD-10-CM | POA: Diagnosis not present

## 2022-11-01 MED ORDER — LISINOPRIL 20 MG PO TABS
20.0000 mg | ORAL_TABLET | Freq: Every day | ORAL | 1 refills | Status: DC
Start: 2022-11-01 — End: 2022-11-24

## 2022-11-01 MED ORDER — PHENTERMINE HCL 37.5 MG PO CAPS: 37.5000 mg | ORAL_CAPSULE | ORAL | 0 refills | Status: DC

## 2022-11-01 MED ORDER — HYDROCODONE-ACETAMINOPHEN 10-325 MG PO TABS: 1.0000 | ORAL_TABLET | Freq: Three times a day (TID) | ORAL | 0 refills | Status: AC | PRN

## 2022-11-01 NOTE — Telephone Encounter (Signed)
  Chief Complaint: drainage and pain from I&D of cyst/abscess  Symptoms: pain- moderate/severe, drainage- can be bloody to yellowish in color, slight odor Frequency: started with ED visit Thursday 6/20 Pertinent Negatives: Patient denies redness, fever Disposition: [] ED /[] Urgent Care (no appt availability in office) / [x] Appointment(In office/virtual)/ []  [facility]/ [] Home Care/ [] Refused Recommended Disposition /[] [facility]/ []  Follow-up with PCP Additional Notes: Patient has been scheduled for office appointment to assess wound- strongly encouraged contact Breast Center as advised by ED physician- reasons to do so given.    Reason for Disposition . [1] Post-op pain AND [2] not controlled with pain medications    Patient concerned about drainage and pain  Answer Assessment - Initial Assessment Questions 1. SYMPTOM: "What's the main symptom you're concerned about?" (e.g., drainage, incision opening up, pain, redness)     Incision drainage- abscess in breast- patient is concerned about drainage and pain- healing process 2. ONSET: "When did   start?"     Abscess appeared 1 week ago- but had smaller area under that spontaneously before larger area appeared 3. SURGERY: "What surgery did you have?"     I&D at ED- Thursday 4. DATE of SURGERY: "When was the surgery?"      ED Thrurday-I&D- 6/20, UC Saturday- cleaned and drained 5. INCISION SITE: "Where is the incision located?"      L breast at the bottom of breast-" under the breast" 6. REDNESS: "Is there any redness at the incision site?" If Yes, ask: "How wide across is the redness?" (Inches, centimeters)      Yes- before- now area is dark around the open area 7. PAIN: "Is there any pain?" If Yes, ask: "How bad is it?"  (Scale 1-10; or mild, moderate, severe)   - NONE (0): no pain   - MILD (1-3): doesn't interfere with normal activities    - MODERATE (4-7): interferes with normal activities or awakens from sleep     - SEVERE (8-10): excruciating pain, unable to do any normal activities     Moderate- 5 8. BLEEDING: "Is there any bleeding?" If Yes, ask: "How much?" and "Where?"     Some bleeding- blood soaked at UC visit- has decreased 9. DRAINAGE: "Is there any drainage from the incision site?" If Yes, ask: "What color and how much?" (e.g., red, cloudy, pus; drops, teaspoon)     yellowish 10. FEVER: "Do you have a fever?" If Yes, ask: "What is your temperature, how was it measured, and when did it start?"       no 11. OTHER SYMPTOMS: "Do you have any other symptoms?" (e.g., dizziness, rash elsewhere on body, shaking chills, weakness)       Random sharp pain in breast  Protocols used: Post-Op Incision Symptoms and Questions-A-AH

## 2022-11-01 NOTE — Telephone Encounter (Signed)
Pt emailed to me her FMLA papers to be printed out and will be filled out per provider. Advised pt I will give her a call when papers are ready for pick up.

## 2022-11-04 ENCOUNTER — Encounter: Payer: Self-pay | Admitting: Family Medicine

## 2022-11-04 ENCOUNTER — Telehealth: Payer: Self-pay | Admitting: Family Medicine

## 2022-11-04 NOTE — Progress Notes (Signed)
Established Patient Office Visit  Subjective    Patient ID: Gina Garza, female    DOB: 1986/03/22  Age: 37 y.o. MRN: [id number]  CC:  Chief Complaint  Patient presents with   Weight Check   Follow-up   Drainage from Incision    HPI Gina Garza presents for routine follow up of chronic med issues and weight management. Patient also reports that she was seen recently at ED for drainage of an abscess on her breast related to HS. She was placed on antibiotics and seems to be improving slowly.    Outpatient Encounter Medications as of 11/01/2022  Medication Sig   doxycycline (VIBRAMYCIN) 100 MG capsule Take 1 capsule (100 mg total) by mouth 2 (two) times daily.   HYDROcodone-acetaminophen (NORCO) 10-325 MG tablet Take 1 tablet by mouth every 8 (eight) hours as needed for up to 7 days.   ibuprofen (ADVIL) 600 MG tablet Take 1 tablet (600 mg total) by mouth every 6 (six) hours as needed.   lisinopril (ZESTRIL) 20 MG tablet Take 1 tablet (20 mg total) by mouth daily.   naproxen (NAPROSYN) 500 MG tablet Take 500 mg by mouth 2 (two) times daily.   [DISCONTINUED] lisinopril (ZESTRIL) 10 MG tablet TAKE 1 TABLET BY MOUTH EVERY DAY   [DISCONTINUED] phentermine 37.5 MG capsule Take 1 capsule (37.5 mg total) by mouth every morning.   lansoprazole (PREVACID) 30 MG capsule Take 1 capsule (30 mg total) by mouth daily at 12 noon. (Patient not taking: Reported on 06/22/2022)   phentermine 37.5 MG capsule Take 1 capsule (37.5 mg total) by mouth every morning.   No facility-administered encounter medications on file as of 11/01/2022.    Past Medical History:  Diagnosis Date   History of gestational diabetes x 2    Was treated with Glyburide both times.  Had normal postpartum 2 hr GTT on 11/14/17 (after second pregnancy)   Morbid obesity with body mass index (BMI) of 40.0 or higher (HCC)     Past Surgical History:  Procedure Laterality Date   CESAREAN SECTION     preE and arrest of  dilation    CESAREAN SECTION WITH BILATERAL TUBAL LIGATION Bilateral 09/30/2017   Procedure: REPEAT CESAREAN SECTION WITH BILATERAL TUBAL LIGATION;  Surgeon: Willodean Rosenthal, MD;  [facility]: [facility];  Service: Obstetrics;  Laterality: Bilateral;   Cysts removed from chest and under arms      Family History  Problem Relation Age of Onset   Hypertension Mother    Stroke Maternal Grandmother    Cancer Neg Hx     Social History   Socioeconomic History   Marital status: Single    Spouse name: Not on file   Number of children: Not on file   Years of education: Not on file   Highest education level: Associate degree: academic program  Occupational History   Not on file  Tobacco Use   Smoking status: Never   Smokeless tobacco: Never  Vaping Use   Vaping Use: Never used  Substance and Sexual Activity   Alcohol use: No   Drug use: No   Sexual activity: Yes  Other Topics Concern   Not on file  Social History Narrative   Not on file   Social Determinants of Health   Financial Resource Strain: Medium Risk (09/20/2022)   Overall Financial Resource Strain (CARDIA)    Difficulty of Paying Living Expenses: Somewhat hard  Food Insecurity: No Food Insecurity (09/20/2022)   Hunger Vital  Sign    Worried About Programme researcher, broadcasting/film/video in the Last Year: Never true    Ran Out of Food in the Last Year: Never true  Transportation Needs: No Transportation Needs (09/20/2022)   PRAPARE - Administrator, Civil Service (Medical): No    Lack of Transportation (Non-Medical): No  Physical Activity: Sufficiently Active (09/20/2022)   Exercise Vital Sign    Days of Exercise per Week: 3 days    Minutes of Exercise per Session: 60 min  Stress: No Stress Concern Present (09/20/2022)   [employer] of Occupational Health - Occupational Stress Questionnaire    Feeling of Stress : Only a little  Social Connections: Moderately Isolated (09/20/2022)   Social Connection and  Isolation Panel [NHANES]    Frequency of Communication with Friends and Family: More than three times a week    Frequency of Social Gatherings with Friends and Family: Once a week    Attends Religious Services: 1 to 4 times per year    Active Member of [employer] or Organizations: No    Attends Engineer, structural: Not on file    Marital Status: Never married  Intimate Partner Violence: Not on file    Review of Systems  All other systems reviewed and are negative.       Objective    BP (!) 153/92   Pulse 90   Temp 98.1 F (36.7 C) (Oral)   Resp 16   Wt 282 lb 9.6 oz (128.2 kg)   SpO2 98%   BMI 43.61 kg/m   Physical Exam Vitals and nursing note reviewed.  Constitutional:      General: She is not in acute distress.    Appearance: She is obese.  Cardiovascular:     Rate and Rhythm: Normal rate and regular rhythm.  Pulmonary:     Effort: Pulmonary effort is normal.     Breath sounds: Normal breath sounds.  Chest:    Neurological:     General: No focal deficit present.     Mental Status: She is alert and oriented to person, place, and time.         Assessment & Plan:   1. Hidradenitis suppurativa Referral to gen surg for further eval/mgt. Norco prescribed - Ambulatory referral to General Surgery  2. Encounter for weight management Doing well with present management. Phentermine prescribed. Goal is 3-5lbs/mo wt loss  3. Class 3 severe obesity due to excess calories without serious comorbidity with body mass index (BMI) of 45.0 to 49.9 in adult [facility]) As above.   4. Essential hypertension Slightly elevated readings. Will increase lisinopril from 10 mg to 20 mg daily.   5. Encounter for completion of form with patient FMLA form completed to return to work after abscence for abscess drainage/HS    Return in about 4 weeks (around 11/29/2022) for follow up.   Tommie Raymond, MD

## 2022-11-04 NOTE — Telephone Encounter (Signed)
Patient came in to finish signing forms and I faxed over paperwork to number listed on forms and sent a copy to scan center

## 2022-11-05 ENCOUNTER — Ambulatory Visit: Payer: BC Managed Care – PPO | Admitting: Family Medicine

## 2022-11-16 NOTE — Telephone Encounter (Signed)
Pt returning call back said missed a call today about her FMLA paperwork. Please call back

## 2022-11-17 NOTE — Telephone Encounter (Signed)
I have attempted to contact this patient by phone with the following results: no answer.

## 2022-11-24 ENCOUNTER — Other Ambulatory Visit: Payer: Self-pay | Admitting: Family Medicine

## 2022-12-03 ENCOUNTER — Ambulatory Visit (INDEPENDENT_AMBULATORY_CARE_PROVIDER_SITE_OTHER): Payer: BC Managed Care – PPO | Admitting: Family Medicine

## 2022-12-03 ENCOUNTER — Other Ambulatory Visit: Payer: Self-pay | Admitting: Family Medicine

## 2022-12-03 ENCOUNTER — Telehealth: Payer: Self-pay | Admitting: Family Medicine

## 2022-12-03 ENCOUNTER — Encounter: Payer: Self-pay | Admitting: Family Medicine

## 2022-12-03 VITALS — BP 123/88 | HR 95 | Temp 98.1°F | Resp 16 | Wt 274.2 lb

## 2022-12-03 DIAGNOSIS — L732 Hidradenitis suppurativa: Secondary | ICD-10-CM | POA: Diagnosis not present

## 2022-12-03 DIAGNOSIS — Z7689 Persons encountering health services in other specified circumstances: Secondary | ICD-10-CM | POA: Diagnosis not present

## 2022-12-03 DIAGNOSIS — Z6841 Body Mass Index (BMI) 40.0 and over, adult: Secondary | ICD-10-CM

## 2022-12-03 MED ORDER — PHENTERMINE HCL 37.5 MG PO CAPS: 37.5000 mg | ORAL_CAPSULE | ORAL | 0 refills | Status: DC

## 2022-12-03 MED ORDER — LISINOPRIL 20 MG PO TABS: 20.0000 mg | ORAL_TABLET | Freq: Every day | ORAL | 1 refills | Status: DC

## 2022-12-03 NOTE — Progress Notes (Unsigned)
Paper to fill out for patient Patient came in for monthly weight check. Patient has no other concerns today

## 2022-12-03 NOTE — Telephone Encounter (Signed)
Patient request medication refills.

## 2022-12-03 NOTE — Progress Notes (Unsigned)
Established Patient Office Visit  Subjective    Patient ID: Gina Garza, female    DOB: 03/07/1986  Age: 37 y.o. MRN: [id number]  CC: No chief complaint on file.   HPI Gina Garza presents to establish care   Outpatient Encounter Medications as of 12/03/2022  Medication Sig   doxycycline (VIBRAMYCIN) 100 MG capsule Take 1 capsule (100 mg total) by mouth 2 (two) times daily.   ibuprofen (ADVIL) 600 MG tablet Take 1 tablet (600 mg total) by mouth every 6 (six) hours as needed.   lansoprazole (PREVACID) 30 MG capsule Take 1 capsule (30 mg total) by mouth daily at 12 noon. (Patient not taking: Reported on 06/22/2022)   lisinopril (ZESTRIL) 20 MG tablet TAKE 1 TABLET BY MOUTH EVERY DAY   naproxen (NAPROSYN) 500 MG tablet Take 500 mg by mouth 2 (two) times daily.   phentermine 37.5 MG capsule Take 1 capsule (37.5 mg total) by mouth every morning.   No facility-administered encounter medications on file as of 12/03/2022.    Past Medical History:  Diagnosis Date   History of gestational diabetes x 2    Was treated with Glyburide both times.  Had normal postpartum 2 hr GTT on 11/14/17 (after second pregnancy)   Morbid obesity with body mass index (BMI) of 40.0 or higher (HCC)     Past Surgical History:  Procedure Laterality Date   CESAREAN SECTION     preE and arrest of dilation    CESAREAN SECTION WITH BILATERAL TUBAL LIGATION Bilateral 09/30/2017   Procedure: REPEAT CESAREAN SECTION WITH BILATERAL TUBAL LIGATION;  Surgeon: Willodean Rosenthal, MD;  Location: [facility];  Service: Obstetrics;  Laterality: Bilateral;   Cysts removed from chest and under arms      Family History  Problem Relation Age of Onset   Hypertension Mother    Stroke Maternal Grandmother    Cancer Neg Hx     Social History   Socioeconomic History   Marital status: Single    Spouse name: Not on file   Number of children: Not on file   Years of education: Not on file   Highest  education level: Associate degree: academic program  Occupational History   Not on file  Tobacco Use   Smoking status: Never   Smokeless tobacco: Never  Vaping Use   Vaping status: Never Used  Substance and Sexual Activity   Alcohol use: No   Drug use: No   Sexual activity: Yes  Other Topics Concern   Not on file  Social History Narrative   Not on file   Social Determinants of Health   Financial Resource Strain: Medium Risk (09/20/2022)   Overall Financial Resource Strain (CARDIA)    Difficulty of Paying Living Expenses: Somewhat hard  Food Insecurity: No Food Insecurity (09/20/2022)   Hunger Vital Sign    Worried About Running Out of Food in the Last Year: Never true    Ran Out of Food in the Last Year: Never true  Transportation Needs: No Transportation Needs (09/20/2022)   PRAPARE - Administrator, Civil Service (Medical): No    Lack of Transportation (Non-Medical): No  Physical Activity: Sufficiently Active (09/20/2022)   Exercise Vital Sign    Days of Exercise per Week: 3 days    Minutes of Exercise per Session: 60 min  Stress: No Stress Concern Present (09/20/2022)   [employer] of Occupational Health - Occupational Stress Questionnaire    Feeling of Stress :  Only a little  Social Connections: Moderately Isolated (09/20/2022)   Social Connection and Isolation Panel [NHANES]    Frequency of Communication with Friends and Family: More than three times a week    Frequency of Social Gatherings with Friends and Family: Once a week    Attends Religious Services: 1 to 4 times per year    Active Member of [employer] or Organizations: No    Attends Engineer, structural: Not on file    Marital Status: Never married  Intimate Partner Violence: Not on file    ROS      Objective    BP 123/88   Pulse 95   Temp 98.1 F (36.7 C) (Oral)   Resp 16   Wt 274 lb 3.2 oz (124.4 kg)   SpO2 98%   BMI 42.31 kg/m   Physical Exam  {Labs (Optional):23779}     Assessment & Plan:   Problem List Items Addressed This Visit   None   No follow-ups on file.   Tommie Raymond, MD

## 2022-12-03 NOTE — Telephone Encounter (Signed)
Patient came back in wanted to make sure that her refill for Bp and Phentermine is sent to pharmacy .

## 2022-12-08 ENCOUNTER — Encounter: Payer: Self-pay | Admitting: Family Medicine

## 2022-12-31 ENCOUNTER — Ambulatory Visit: Payer: BC Managed Care – PPO | Admitting: Family Medicine

## 2023-01-21 ENCOUNTER — Ambulatory Visit (INDEPENDENT_AMBULATORY_CARE_PROVIDER_SITE_OTHER): Payer: BC Managed Care – PPO | Admitting: Family Medicine

## 2023-01-21 ENCOUNTER — Encounter: Payer: Self-pay | Admitting: Family Medicine

## 2023-01-21 VITALS — BP 130/92 | HR 83 | Temp 98.6°F | Resp 18 | Ht 67.5 in | Wt 263.0 lb

## 2023-01-21 DIAGNOSIS — Z7689 Persons encountering health services in other specified circumstances: Secondary | ICD-10-CM | POA: Diagnosis not present

## 2023-01-21 DIAGNOSIS — Z6841 Body Mass Index (BMI) 40.0 and over, adult: Secondary | ICD-10-CM

## 2023-01-21 MED ORDER — PHENTERMINE HCL 37.5 MG PO CAPS: 37.5000 mg | ORAL_CAPSULE | ORAL | 0 refills | Status: DC

## 2023-01-24 ENCOUNTER — Encounter: Payer: Self-pay | Admitting: Family Medicine

## 2023-01-24 ENCOUNTER — Other Ambulatory Visit: Payer: Self-pay | Admitting: Family Medicine

## 2023-01-24 NOTE — Progress Notes (Signed)
Established Patient Office Visit  Subjective    Patient ID: Gina Garza, female    DOB: 1985/10/28  Age: 37 y.o. MRN: [id number]  CC:  Chief Complaint  Patient presents with   Weight Check    B/p check    HPI SERAS KOONS presents for weight management. Patient denies acute complaints.   Outpatient Encounter Medications as of 01/21/2023  Medication Sig   ibuprofen (ADVIL) 600 MG tablet Take 1 tablet (600 mg total) by mouth every 6 (six) hours as needed.   lansoprazole (PREVACID) 30 MG capsule Take 1 capsule (30 mg total) by mouth daily at 12 noon.   lisinopril (ZESTRIL) 20 MG tablet Take 1 tablet (20 mg total) by mouth daily.   naproxen (NAPROSYN) 500 MG tablet Take 500 mg by mouth 2 (two) times daily.   [DISCONTINUED] phentermine 37.5 MG capsule Take 1 capsule (37.5 mg total) by mouth every morning.   doxycycline (VIBRAMYCIN) 100 MG capsule Take 1 capsule (100 mg total) by mouth 2 (two) times daily. (Patient not taking: Reported on 01/21/2023)   phentermine 37.5 MG capsule Take 1 capsule (37.5 mg total) by mouth every morning.   No facility-administered encounter medications on file as of 01/21/2023.    Past Medical History:  Diagnosis Date   History of gestational diabetes x 2    Was treated with Glyburide both times.  Had normal postpartum 2 hr GTT on 11/14/17 (after second pregnancy)   Morbid obesity with body mass index (BMI) of 40.0 or higher (HCC)     Past Surgical History:  Procedure Laterality Date   CESAREAN SECTION     preE and arrest of dilation    CESAREAN SECTION WITH BILATERAL TUBAL LIGATION Bilateral 09/30/2017   Procedure: REPEAT CESAREAN SECTION WITH BILATERAL TUBAL LIGATION;  Surgeon: Willodean Rosenthal, MD;  Location: [facility];  Service: Obstetrics;  Laterality: Bilateral;   Cysts removed from chest and under arms      Family History  Problem Relation Age of Onset   Hypertension Mother    Stroke Maternal Grandmother    Cancer  Neg Hx     Social History   Socioeconomic History   Marital status: Single    Spouse name: Not on file   Number of children: Not on file   Years of education: Not on file   Highest education level: Associate degree: academic program  Occupational History   Not on file  Tobacco Use   Smoking status: Never   Smokeless tobacco: Never  Vaping Use   Vaping status: Never Used  Substance and Sexual Activity   Alcohol use: No   Drug use: No   Sexual activity: Yes  Other Topics Concern   Not on file  Social History Narrative   Not on file   Social Determinants of Health   Financial Resource Strain: Medium Risk (09/20/2022)   Overall Financial Resource Strain (CARDIA)    Difficulty of Paying Living Expenses: Somewhat hard  Food Insecurity: No Food Insecurity (09/20/2022)   Hunger Vital Sign    Worried About Running Out of Food in the Last Year: Never true    Ran Out of Food in the Last Year: Never true  Transportation Needs: No Transportation Needs (09/20/2022)   PRAPARE - Administrator, Civil Service (Medical): No    Lack of Transportation (Non-Medical): No  Physical Activity: Sufficiently Active (09/20/2022)   Exercise Vital Sign    Days of Exercise per Week: 3 days  Minutes of Exercise per Session: 60 min  Stress: No Stress Concern Present (01/21/2023)   [employer] of Occupational Health - Occupational Stress Questionnaire    Feeling of Stress : Not at all  Social Connections: Moderately Isolated (09/20/2022)   Social Connection and Isolation Panel [NHANES]    Frequency of Communication with Friends and Family: More than three times a week    Frequency of Social Gatherings with Friends and Family: Once a week    Attends Religious Services: 1 to 4 times per year    Active Member of [employer] or Organizations: No    Attends Banker Meetings: Not on file    Marital Status: Never married  Intimate Partner Violence: Not At Risk (01/21/2023)    Humiliation, Afraid, Rape, and Kick questionnaire    Fear of Current or Ex-Partner: No    Emotionally Abused: No    Physically Abused: No    Sexually Abused: No    Review of Systems  All other systems reviewed and are negative.       Objective    BP (!) 130/92 (BP Location: Right Arm, Patient Position: Sitting, Cuff Size: Large)   Pulse 83   Temp 98.6 F (37 C) (Oral)   Resp 18   Ht 5' 7.5" (1.715 m)   Wt 263 lb (119.3 kg)   SpO2 95%   BMI 40.58 kg/m   Physical Exam Vitals and nursing note reviewed.  Constitutional:      General: She is not in acute distress.    Appearance: She is obese.  Cardiovascular:     Rate and Rhythm: Normal rate and regular rhythm.  Pulmonary:     Effort: Pulmonary effort is normal.     Breath sounds: Normal breath sounds.  Neurological:     General: No focal deficit present.     Mental Status: She is alert and oriented to person, place, and time.         Assessment & Plan:   Encounter for weight management  Class 3 severe obesity due to excess calories with serious comorbidity and body mass index (BMI) of 40.0 to 44.9 in adult [facility])  Other orders -     Phentermine HCl; Take 1 capsule (37.5 mg total) by mouth every morning.  Dispense: 30 capsule; Refill: 0     No follow-ups on file.   Tommie Raymond, MD

## 2023-02-01 DIAGNOSIS — L732 Hidradenitis suppurativa: Secondary | ICD-10-CM | POA: Diagnosis not present

## 2023-02-25 ENCOUNTER — Ambulatory Visit: Payer: BC Managed Care – PPO | Admitting: Family Medicine

## 2023-03-23 ENCOUNTER — Ambulatory Visit (INDEPENDENT_AMBULATORY_CARE_PROVIDER_SITE_OTHER): Payer: BC Managed Care – PPO | Admitting: Family Medicine

## 2023-03-23 ENCOUNTER — Encounter: Payer: Self-pay | Admitting: Family Medicine

## 2023-03-23 VITALS — BP 144/94 | HR 89 | Temp 98.5°F | Ht 67.5 in | Wt 267.4 lb

## 2023-03-23 DIAGNOSIS — I10 Essential (primary) hypertension: Secondary | ICD-10-CM | POA: Diagnosis not present

## 2023-03-23 DIAGNOSIS — E66813 Obesity, class 3: Secondary | ICD-10-CM

## 2023-03-23 DIAGNOSIS — Z6841 Body Mass Index (BMI) 40.0 and over, adult: Secondary | ICD-10-CM

## 2023-03-23 DIAGNOSIS — Z7689 Persons encountering health services in other specified circumstances: Secondary | ICD-10-CM

## 2023-03-23 MED ORDER — HYDROCHLOROTHIAZIDE 12.5 MG PO TABS
12.5000 mg | ORAL_TABLET | Freq: Every day | ORAL | 0 refills | Status: DC
Start: 2023-03-23 — End: 2023-08-15

## 2023-03-23 NOTE — Progress Notes (Signed)
Established Patient Office Visit  Subjective    Patient ID: Gina Garza, female    DOB: 1986/04/17  Age: 37 y.o. MRN: [id number]  CC:  Chief Complaint  Patient presents with   Follow-up    HPI Gina Garza presents for weight management.patient reports that she was not taking meds as recommended. Her insurance may pay something on wegovy in the new year.   Outpatient Encounter Medications as of 03/23/2023  Medication Sig   hydrochlorothiazide (HYDRODIURIL) 12.5 MG tablet Take 1 tablet (12.5 mg total) by mouth daily.   ibuprofen (ADVIL) 600 MG tablet Take 1 tablet (600 mg total) by mouth every 6 (six) hours as needed.   lansoprazole (PREVACID) 30 MG capsule Take 1 capsule (30 mg total) by mouth daily at 12 noon.   lisinopril (ZESTRIL) 10 MG tablet TAKE 1 TABLET BY MOUTH EVERY DAY   lisinopril (ZESTRIL) 20 MG tablet Take 1 tablet (20 mg total) by mouth daily.   naproxen (NAPROSYN) 500 MG tablet Take 500 mg by mouth 2 (two) times daily.   phentermine 37.5 MG capsule Take 1 capsule (37.5 mg total) by mouth every morning.   doxycycline (VIBRAMYCIN) 100 MG capsule Take 1 capsule (100 mg total) by mouth 2 (two) times daily. (Patient not taking: Reported on 01/21/2023)   No facility-administered encounter medications on file as of 03/23/2023.    Past Medical History:  Diagnosis Date   History of gestational diabetes x 2    Was treated with Glyburide both times.  Had normal postpartum 2 hr GTT on 11/14/17 (after second pregnancy)   Morbid obesity with body mass index (BMI) of 40.0 or higher (HCC)     Past Surgical History:  Procedure Laterality Date   CESAREAN SECTION     preE and arrest of dilation    CESAREAN SECTION WITH BILATERAL TUBAL LIGATION Bilateral 09/30/2017   Procedure: REPEAT CESAREAN SECTION WITH BILATERAL TUBAL LIGATION;  Surgeon: Willodean Rosenthal, MD;  Location: [facility];  Service: Obstetrics;  Laterality: Bilateral;   Cysts removed from chest  and under arms      Family History  Problem Relation Age of Onset   Hypertension Mother    Stroke Maternal Grandmother    Cancer Neg Hx     Social History   Socioeconomic History   Marital status: Single    Spouse name: Not on file   Number of children: Not on file   Years of education: Not on file   Highest education level: Associate degree: academic program  Occupational History   Not on file  Tobacco Use   Smoking status: Never   Smokeless tobacco: Never  Vaping Use   Vaping status: Never Used  Substance and Sexual Activity   Alcohol use: No   Drug use: No   Sexual activity: Yes  Other Topics Concern   Not on file  Social History Narrative   Not on file   Social Determinants of Health   Financial Resource Strain: Medium Risk (02/21/2023)   Overall Financial Resource Strain (CARDIA)    Difficulty of Paying Living Expenses: Somewhat hard  Food Insecurity: No Food Insecurity (02/21/2023)   Hunger Vital Sign    Worried About Running Out of Food in the Last Year: Never true    Ran Out of Food in the Last Year: Never true  Transportation Needs: No Transportation Needs (02/21/2023)   PRAPARE - Administrator, Civil Service (Medical): No    Lack of Transportation (  Non-Medical): No  Physical Activity: Insufficiently Active (02/21/2023)   Exercise Vital Sign    Days of Exercise per Week: 3 days    Minutes of Exercise per Session: 30 min  Stress: Stress Concern Present (02/21/2023)   [employer] of Occupational Health - Occupational Stress Questionnaire    Feeling of Stress : To some extent  Social Connections: Moderately Isolated (02/21/2023)   Social Connection and Isolation Panel [NHANES]    Frequency of Communication with Friends and Family: Once a week    Frequency of Social Gatherings with Friends and Family: Twice a week    Attends Religious Services: 1 to 4 times per year    Active Member of [employer] or Organizations: No    Attends Tax inspector Meetings: Not on file    Marital Status: Never married  Intimate Partner Violence: Not At Risk (01/21/2023)   Humiliation, Afraid, Rape, and Kick questionnaire    Fear of Current or Ex-Partner: No    Emotionally Abused: No    Physically Abused: No    Sexually Abused: No    Review of Systems  All other systems reviewed and are negative.       Objective    BP (!) 144/94 (BP Location: Right Arm, Patient Position: Sitting, Cuff Size: Large)   Pulse 89   Temp 98.5 F (36.9 C) (Oral)   Ht 5' 7.5" (1.715 m)   Wt 267 lb 6.4 oz (121.3 kg)   HC 18" (45.7 cm)   BMI 41.26 kg/m   Physical Exam Vitals and nursing note reviewed.  Constitutional:      General: She is not in acute distress. Cardiovascular:     Rate and Rhythm: Normal rate and regular rhythm.  Pulmonary:     Effort: Pulmonary effort is normal.     Breath sounds: Normal breath sounds.  Abdominal:     Palpations: Abdomen is soft.     Tenderness: There is no abdominal tenderness.  Neurological:     General: No focal deficit present.     Mental Status: She is alert and oriented to person, place, and time.         Assessment & Plan:  1. Encounter for weight management Patient has gained 4 lbs. Will take a 'Holiday" from the meds until the new year. Doing well with about 60+lb wt loss.   2. Class 3 severe obesity due to excess calories with serious comorbidity and body mass index (BMI) of 40.0 to 44.9 in adult [facility]) As above  3. Essential hypertension Slightly elevated readings. Will add hydrochlorothiazide 12.5 mg daily to regimen  Return in about 2 months (around 05/23/2023).   Tommie Raymond, MD

## 2023-05-06 DIAGNOSIS — Z20822 Contact with and (suspected) exposure to covid-19: Secondary | ICD-10-CM | POA: Diagnosis not present

## 2023-05-06 DIAGNOSIS — R051 Acute cough: Secondary | ICD-10-CM | POA: Diagnosis not present

## 2023-05-06 DIAGNOSIS — J069 Acute upper respiratory infection, unspecified: Secondary | ICD-10-CM | POA: Diagnosis not present

## 2023-05-06 DIAGNOSIS — R0981 Nasal congestion: Secondary | ICD-10-CM | POA: Diagnosis not present

## 2023-05-06 DIAGNOSIS — M791 Myalgia, unspecified site: Secondary | ICD-10-CM | POA: Diagnosis not present

## 2023-05-20 ENCOUNTER — Encounter: Payer: Self-pay | Admitting: *Deleted

## 2023-05-20 ENCOUNTER — Other Ambulatory Visit: Payer: Self-pay

## 2023-05-26 ENCOUNTER — Ambulatory Visit: Payer: BC Managed Care – PPO | Admitting: Family Medicine

## 2023-06-14 ENCOUNTER — Ambulatory Visit: Payer: BC Managed Care – PPO | Admitting: Family Medicine

## 2023-06-22 DIAGNOSIS — R059 Cough, unspecified: Secondary | ICD-10-CM | POA: Diagnosis not present

## 2023-07-01 ENCOUNTER — Ambulatory Visit: Payer: BC Managed Care – PPO | Admitting: Family Medicine

## 2023-07-01 ENCOUNTER — Encounter: Payer: Self-pay | Admitting: Family Medicine

## 2023-07-01 VITALS — BP 139/96 | HR 91 | Temp 98.3°F | Resp 18 | Ht 67.5 in | Wt 258.6 lb

## 2023-07-01 DIAGNOSIS — E66813 Obesity, class 3: Secondary | ICD-10-CM | POA: Diagnosis not present

## 2023-07-01 DIAGNOSIS — Z7689 Persons encountering health services in other specified circumstances: Secondary | ICD-10-CM

## 2023-07-01 DIAGNOSIS — I10 Essential (primary) hypertension: Secondary | ICD-10-CM

## 2023-07-01 DIAGNOSIS — R7303 Prediabetes: Secondary | ICD-10-CM

## 2023-07-01 DIAGNOSIS — Z6841 Body Mass Index (BMI) 40.0 and over, adult: Secondary | ICD-10-CM | POA: Diagnosis not present

## 2023-07-01 MED ORDER — WEGOVY 0.25 MG/0.5ML ~~LOC~~ SOAJ
0.2500 mg | SUBCUTANEOUS | 0 refills | Status: AC
Start: 2023-07-01 — End: ?

## 2023-07-01 MED ORDER — PHENTERMINE HCL 37.5 MG PO CAPS: 37.5000 mg | ORAL_CAPSULE | ORAL | 0 refills | Status: AC

## 2023-07-06 ENCOUNTER — Encounter: Payer: Self-pay | Admitting: Family Medicine

## 2023-07-06 NOTE — Progress Notes (Signed)
 Established Patient Office Visit  Subjective    Patient ID: Gina Garza, female    DOB: 06/30/1985  Age: 38 y.o. MRN: [id number]  CC:  Chief Complaint  Patient presents with   Follow-up    2 week, weight loss    HPI Gina Garza presents for routine weight management. She had previously stalled out on weight loss with phentermine and took a 3 month hiatus. She would like to transition to [employer]. She has been working hard and has lost weight in there interim. She denies acute complaints.   Outpatient Encounter Medications as of 07/01/2023  Medication Sig   hydrochlorothiazide (HYDRODIURIL) 12.5 MG tablet Take 1 tablet (12.5 mg total) by mouth daily.   lisinopril (ZESTRIL) 20 MG tablet Take 1 tablet (20 mg total) by mouth daily.   Semaglutide-Weight Management (WEGOVY) 0.25 MG/0.5ML SOAJ Inject 0.25 mg into the skin once a week.   [DISCONTINUED] phentermine 37.5 MG capsule Take 1 capsule (37.5 mg total) by mouth every morning.   doxycycline (VIBRAMYCIN) 100 MG capsule Take 1 capsule (100 mg total) by mouth 2 (two) times daily. (Patient not taking: Reported on 01/21/2023)   ibuprofen (ADVIL) 600 MG tablet Take 1 tablet (600 mg total) by mouth every 6 (six) hours as needed. (Patient not taking: Reported on 07/01/2023)   lansoprazole (PREVACID) 30 MG capsule Take 1 capsule (30 mg total) by mouth daily at 12 noon. (Patient not taking: Reported on 07/01/2023)   lisinopril (ZESTRIL) 10 MG tablet TAKE 1 TABLET BY MOUTH EVERY DAY (Patient not taking: Reported on 07/01/2023)   naproxen (NAPROSYN) 500 MG tablet Take 500 mg by mouth 2 (two) times daily. (Patient not taking: Reported on 07/01/2023)   phentermine 37.5 MG capsule Take 1 capsule (37.5 mg total) by mouth every morning.   No facility-administered encounter medications on file as of 07/01/2023.    Past Medical History:  Diagnosis Date   History of gestational diabetes x 2    Was treated with Glyburide both times.  Had normal  postpartum 2 hr GTT on 11/14/17 (after second pregnancy)   Morbid obesity with body mass index (BMI) of 40.0 or higher (HCC)     Past Surgical History:  Procedure Laterality Date   CESAREAN SECTION     preE and arrest of dilation    CESAREAN SECTION WITH BILATERAL TUBAL LIGATION Bilateral 09/30/2017   Procedure: REPEAT CESAREAN SECTION WITH BILATERAL TUBAL LIGATION;  Surgeon: Willodean Rosenthal, MD;  Location: [facility];  Service: Obstetrics;  Laterality: Bilateral;   Cysts removed from chest and under arms      Family History  Problem Relation Age of Onset   Hypertension Mother    Stroke Maternal Grandmother    Cancer Neg Hx     Social History   Socioeconomic History   Marital status: Single    Spouse name: Not on file   Number of children: Not on file   Years of education: Not on file   Highest education level: Associate degree: academic program  Occupational History   Not on file  Tobacco Use   Smoking status: Never   Smokeless tobacco: Never  Vaping Use   Vaping status: Never Used  Substance and Sexual Activity   Alcohol use: No   Drug use: No   Sexual activity: Yes  Other Topics Concern   Not on file  Social History Narrative   Not on file   Social Drivers of Health   Financial Resource Strain: Medium  Risk (02/21/2023)   Overall Financial Resource Strain (CARDIA)    Difficulty of Paying Living Expenses: Somewhat hard  Food Insecurity: No Food Insecurity (02/21/2023)   Hunger Vital Sign    Worried About Running Out of Food in the Last Year: Never true    Ran Out of Food in the Last Year: Never true  Transportation Needs: No Transportation Needs (02/21/2023)   PRAPARE - Administrator, Civil Service (Medical): No    Lack of Transportation (Non-Medical): No  Physical Activity: Insufficiently Active (02/21/2023)   Exercise Vital Sign    Days of Exercise per Week: 3 days    Minutes of Exercise per Session: 30 min  Stress: Stress  Concern Present (02/21/2023)   [employer] of Occupational Health - Occupational Stress Questionnaire    Feeling of Stress : To some extent  Social Connections: Moderately Isolated (02/21/2023)   Social Connection and Isolation Panel [NHANES]    Frequency of Communication with Friends and Family: Once a week    Frequency of Social Gatherings with Friends and Family: Twice a week    Attends Religious Services: 1 to 4 times per year    Active Member of [employer] or Organizations: No    Attends Banker Meetings: Not on file    Marital Status: Never married  Intimate Partner Violence: Not At Risk (01/21/2023)   Humiliation, Afraid, Rape, and Kick questionnaire    Fear of Current or Ex-Partner: No    Emotionally Abused: No    Physically Abused: No    Sexually Abused: No    Review of Systems  All other systems reviewed and are negative.       Objective    BP (!) 139/96 (BP Location: Right Arm, Patient Position: Sitting, Cuff Size: Normal)   Pulse 91   Temp 98.3 F (36.8 C) (Oral)   Resp 18   Ht 5' 7.5" (1.715 m)   Wt 258 lb 9.6 oz (117.3 kg)   SpO2 97%   BMI 39.90 kg/m   Physical Exam Vitals and nursing note reviewed.  Constitutional:      General: She is not in acute distress.    Appearance: She is obese.  Cardiovascular:     Rate and Rhythm: Normal rate and regular rhythm.  Pulmonary:     Effort: Pulmonary effort is normal.     Breath sounds: Normal breath sounds.  Abdominal:     Palpations: Abdomen is soft.     Tenderness: There is no abdominal tenderness.  Neurological:     General: No focal deficit present.     Mental Status: She is alert and oriented to person, place, and time.         Assessment & Plan:  1. Encounter for weight management (Primary) Phentermine and wegovy prescribed. Patient will d/c phentermine when and if wegovy is approved by her insurance.   2. Class 3 severe obesity due to excess calories with serious comorbidity and  body mass index (BMI) of 40.0 to 44.9 in adult (HCC)   3. Prediabetes   4. Essential hypertension Slightly elevated readings. Patient to restart lisinopril along with HCTZ   Return in about 4 weeks (around 07/29/2023) for follow up.   Tommie Raymond, MD

## 2023-07-12 ENCOUNTER — Telehealth: Payer: Self-pay

## 2023-07-12 ENCOUNTER — Other Ambulatory Visit: Payer: Self-pay

## 2023-07-12 NOTE — Telephone Encounter (Signed)
 FYI

## 2023-07-12 NOTE — Telephone Encounter (Signed)
 Copied from CRM [id number]. Topic: Clinical - Prescription Issue >> Jul 12, 2023  3:06 PM Carlatta H wrote: Reason for CRM: Patient would like call back once [facility] prior authorization is back

## 2023-07-13 ENCOUNTER — Other Ambulatory Visit: Payer: Self-pay

## 2023-07-20 ENCOUNTER — Other Ambulatory Visit: Payer: Self-pay

## 2023-07-20 NOTE — Telephone Encounter (Signed)
 Pharmacy Patient Advocate Encounter  Received notification from [facility] that Prior Authorization for wegovy has been  DENIED. COVERMYMEDS KEY: [id number]. REJECTION MESSAGE READS: The receiver is not the PA processor for this patient and medication combination. Please reach out to [phone] for additional information. Called the number provided and reached Marin Comment at [facility] who informed me that patient needs to call above number to talk to a representative about medication coverage. They need to speak to the patient directly initially to obtain information and initiate any benefits coverage review with [facility] specifically.

## 2023-07-22 NOTE — Telephone Encounter (Signed)
 I called and spoke with patient and made her aware that they refuse her prior authorization for wegovy.  I gave the [phone] number to call  to get more information.

## 2023-08-05 ENCOUNTER — Ambulatory Visit: Payer: BC Managed Care – PPO | Admitting: Family Medicine

## 2023-08-13 ENCOUNTER — Other Ambulatory Visit: Payer: Self-pay | Admitting: Family Medicine

## 2023-08-25 ENCOUNTER — Other Ambulatory Visit: Payer: Self-pay | Admitting: Family Medicine

## 2024-03-02 NOTE — Progress Notes (Signed)
 Gina Garza                                          MRN: [id number]   03/02/2024   The [facility] Quality Team Specialist reviewed this patient medical record for the purposes of chart review for care gap closure. The following were reviewed: chart review for care gap closure-controlling blood pressure.    [facility] Quality Team
